# Patient Record
Sex: Male | Born: 1935 | Race: White | Hispanic: No | Marital: Married | Smoking: Former smoker
Health system: Southern US, Community
[De-identification: ages and names within clinical notes are randomized; demographics above are authoritative.]

## PROBLEM LIST (undated history)

## (undated) DIAGNOSIS — M109 Gout, unspecified: Secondary | ICD-10-CM

## (undated) DIAGNOSIS — R55 Syncope and collapse: Secondary | ICD-10-CM

## (undated) DIAGNOSIS — F0390 Unspecified dementia without behavioral disturbance: Secondary | ICD-10-CM

## (undated) DIAGNOSIS — C801 Malignant (primary) neoplasm, unspecified: Secondary | ICD-10-CM

## (undated) DIAGNOSIS — Z950 Presence of cardiac pacemaker: Secondary | ICD-10-CM

## (undated) DIAGNOSIS — E538 Deficiency of other specified B group vitamins: Secondary | ICD-10-CM

## (undated) DIAGNOSIS — D090 Carcinoma in situ of bladder: Secondary | ICD-10-CM

## (undated) DIAGNOSIS — R001 Bradycardia, unspecified: Secondary | ICD-10-CM

## (undated) DIAGNOSIS — N320 Bladder-neck obstruction: Secondary | ICD-10-CM

## (undated) DIAGNOSIS — K219 Gastro-esophageal reflux disease without esophagitis: Secondary | ICD-10-CM

## (undated) DIAGNOSIS — E785 Hyperlipidemia, unspecified: Secondary | ICD-10-CM

## (undated) DIAGNOSIS — H9190 Unspecified hearing loss, unspecified ear: Secondary | ICD-10-CM

## (undated) HISTORY — PX: HERNIA REPAIR: SHX51

## (undated) HISTORY — DX: Syncope and collapse: R55

## (undated) HISTORY — DX: Carcinoma in situ of bladder: D09.0

## (undated) HISTORY — DX: Bradycardia, unspecified: R00.1

## (undated) HISTORY — DX: Bladder-neck obstruction: N32.0

## (undated) HISTORY — DX: Hyperlipidemia, unspecified: E78.5

## (undated) HISTORY — DX: Deficiency of other specified B group vitamins: E53.8

## (undated) HISTORY — DX: Gout, unspecified: M10.9

---

## 2004-01-17 ENCOUNTER — Ambulatory Visit: Payer: Self-pay | Admitting: Internal Medicine

## 2004-02-09 ENCOUNTER — Ambulatory Visit: Payer: Self-pay | Admitting: Internal Medicine

## 2004-04-12 ENCOUNTER — Encounter: Admission: RE | Admit: 2004-04-12 | Discharge: 2004-04-12 | Payer: Self-pay | Admitting: Surgery

## 2004-04-16 ENCOUNTER — Ambulatory Visit (HOSPITAL_COMMUNITY): Admission: RE | Admit: 2004-04-16 | Discharge: 2004-04-16 | Payer: Self-pay | Admitting: Surgery

## 2004-04-16 ENCOUNTER — Ambulatory Visit (HOSPITAL_BASED_OUTPATIENT_CLINIC_OR_DEPARTMENT_OTHER): Admission: RE | Admit: 2004-04-16 | Discharge: 2004-04-16 | Payer: Self-pay | Admitting: Surgery

## 2004-04-16 ENCOUNTER — Encounter (INDEPENDENT_AMBULATORY_CARE_PROVIDER_SITE_OTHER): Payer: Self-pay | Admitting: *Deleted

## 2004-04-16 HISTORY — PX: INGUINAL HERNIA REPAIR: SHX194

## 2004-12-22 ENCOUNTER — Ambulatory Visit: Payer: Self-pay | Admitting: Internal Medicine

## 2005-07-08 ENCOUNTER — Ambulatory Visit: Payer: Self-pay | Admitting: Internal Medicine

## 2005-08-19 ENCOUNTER — Ambulatory Visit: Payer: Self-pay | Admitting: Internal Medicine

## 2005-12-12 ENCOUNTER — Ambulatory Visit: Payer: Self-pay | Admitting: Internal Medicine

## 2006-01-12 ENCOUNTER — Emergency Department (HOSPITAL_COMMUNITY): Admission: EM | Admit: 2006-01-12 | Discharge: 2006-01-12 | Payer: Self-pay | Admitting: Emergency Medicine

## 2006-05-20 ENCOUNTER — Emergency Department (HOSPITAL_COMMUNITY): Admission: EM | Admit: 2006-05-20 | Discharge: 2006-05-21 | Payer: Self-pay | Admitting: Emergency Medicine

## 2006-05-30 ENCOUNTER — Ambulatory Visit: Payer: Self-pay | Admitting: Family Medicine

## 2006-06-03 ENCOUNTER — Ambulatory Visit: Payer: Self-pay | Admitting: Internal Medicine

## 2006-06-10 ENCOUNTER — Ambulatory Visit: Payer: Self-pay | Admitting: Internal Medicine

## 2006-06-12 ENCOUNTER — Encounter: Admission: RE | Admit: 2006-06-12 | Discharge: 2006-06-12 | Payer: Self-pay | Admitting: Internal Medicine

## 2006-09-04 DIAGNOSIS — D090 Carcinoma in situ of bladder: Secondary | ICD-10-CM | POA: Insufficient documentation

## 2006-09-04 DIAGNOSIS — M109 Gout, unspecified: Secondary | ICD-10-CM | POA: Insufficient documentation

## 2006-09-08 DIAGNOSIS — E785 Hyperlipidemia, unspecified: Secondary | ICD-10-CM

## 2006-12-19 ENCOUNTER — Ambulatory Visit: Payer: Self-pay | Admitting: Internal Medicine

## 2007-05-20 ENCOUNTER — Ambulatory Visit: Payer: Self-pay | Admitting: Internal Medicine

## 2007-05-25 LAB — CONVERTED CEMR LAB
Basophils Absolute: 0.4 10*3/uL — ABNORMAL HIGH (ref 0.0–0.1)
Basophils Relative: 5.6 % — ABNORMAL HIGH (ref 0.0–1.0)
Calcium: 8.9 mg/dL (ref 8.4–10.5)
Chloride: 106 meq/L (ref 96–112)
Creatinine, Ser: 1.1 mg/dL (ref 0.4–1.5)
Eosinophils Relative: 1.8 % (ref 0.0–5.0)
GFR calc Af Amer: 83 mL/min
GFR calc non Af Amer: 69 mL/min
HCT: 42.7 % (ref 39.0–52.0)
HDL: 23.8 mg/dL — ABNORMAL LOW (ref >39.0)
Hgb A1c MFr Bld: 6 % (ref 4.6–6.0)
Lymphocytes Relative: 35.7 % (ref 12.0–46.0)
MCV: 91.7 fL (ref 78.0–100.0)
Monocytes Relative: 7.3 % (ref 3.0–12.0)
Neutro Abs: 3.8 10*3/uL (ref 1.4–7.7)
Neutrophils Relative %: 49.6 % (ref 43.0–77.0)
RBC: 4.66 M/uL (ref 4.22–5.81)
TSH: 3.02 microintl units/mL (ref 0.35–5.50)
WBC: 7.4 10*3/uL (ref 4.5–10.5)

## 2007-07-23 ENCOUNTER — Ambulatory Visit: Payer: Self-pay | Admitting: Cardiology

## 2007-07-23 ENCOUNTER — Ambulatory Visit: Payer: Self-pay | Admitting: Internal Medicine

## 2007-07-24 ENCOUNTER — Encounter: Payer: Self-pay | Admitting: Internal Medicine

## 2007-07-24 ENCOUNTER — Ambulatory Visit: Payer: Self-pay | Admitting: Vascular Surgery

## 2007-07-24 ENCOUNTER — Inpatient Hospital Stay (HOSPITAL_COMMUNITY): Admission: EM | Admit: 2007-07-24 | Discharge: 2007-07-24 | Payer: Self-pay | Admitting: Emergency Medicine

## 2007-08-10 ENCOUNTER — Ambulatory Visit: Payer: Self-pay | Admitting: Internal Medicine

## 2007-11-19 ENCOUNTER — Ambulatory Visit: Payer: Self-pay | Admitting: Internal Medicine

## 2008-01-20 ENCOUNTER — Ambulatory Visit: Payer: Self-pay | Admitting: Internal Medicine

## 2008-01-21 ENCOUNTER — Ambulatory Visit: Payer: Self-pay

## 2008-01-21 ENCOUNTER — Encounter: Payer: Self-pay | Admitting: Internal Medicine

## 2008-02-03 ENCOUNTER — Ambulatory Visit: Payer: Self-pay | Admitting: Internal Medicine

## 2008-02-03 DIAGNOSIS — N320 Bladder-neck obstruction: Secondary | ICD-10-CM | POA: Insufficient documentation

## 2008-05-19 ENCOUNTER — Ambulatory Visit: Payer: Self-pay | Admitting: Internal Medicine

## 2008-05-23 ENCOUNTER — Encounter: Payer: Self-pay | Admitting: Internal Medicine

## 2008-05-23 ENCOUNTER — Ambulatory Visit: Payer: Self-pay | Admitting: Internal Medicine

## 2008-05-26 ENCOUNTER — Ambulatory Visit: Payer: Self-pay | Admitting: Internal Medicine

## 2008-05-27 ENCOUNTER — Ambulatory Visit: Payer: Self-pay

## 2008-05-30 LAB — CONVERTED CEMR LAB
Chloride: 109 meq/L (ref 96–112)
GFR calc non Af Amer: 68.39 mL/min (ref >60)
Glucose, Bld: 113 mg/dL — ABNORMAL HIGH (ref 70–99)
Hemoglobin: 14.2 g/dL (ref 13.0–17.0)
Lymphocytes Relative: 30.5 % (ref 12.0–46.0)
Lymphs Abs: 2.6 10*3/uL (ref 0.7–4.0)
MCV: 92 fL (ref 78.0–100.0)
Monocytes Relative: 9.2 % (ref 3.0–12.0)
Neutro Abs: 4.7 10*3/uL (ref 1.4–7.7)
Neutrophils Relative %: 57.1 % (ref 43.0–77.0)
Platelets: 173 10*3/uL (ref 150.0–400.0)
Potassium: 3.9 meq/L (ref 3.5–5.1)
RBC: 4.44 M/uL (ref 4.22–5.81)
RDW: 12.5 % (ref 11.5–14.6)
WBC: 8.4 10*3/uL (ref 4.5–10.5)

## 2008-06-01 ENCOUNTER — Ambulatory Visit: Payer: Self-pay

## 2008-06-01 ENCOUNTER — Encounter: Payer: Self-pay | Admitting: Internal Medicine

## 2008-06-02 ENCOUNTER — Ambulatory Visit (HOSPITAL_COMMUNITY): Admission: RE | Admit: 2008-06-02 | Discharge: 2008-06-02 | Payer: Self-pay | Admitting: Internal Medicine

## 2008-06-02 ENCOUNTER — Ambulatory Visit: Payer: Self-pay | Admitting: Internal Medicine

## 2008-06-13 ENCOUNTER — Ambulatory Visit: Payer: Self-pay

## 2008-07-21 ENCOUNTER — Ambulatory Visit: Payer: Self-pay | Admitting: Internal Medicine

## 2008-07-21 DIAGNOSIS — R001 Bradycardia, unspecified: Secondary | ICD-10-CM

## 2008-07-21 LAB — CONVERTED CEMR LAB
BUN: 20 mg/dL (ref 6–23)
Basophils Absolute: 0 10*3/uL (ref 0.0–0.1)
Calcium: 9.2 mg/dL (ref 8.4–10.5)
Creatinine, Ser: 1.1 mg/dL (ref 0.4–1.5)
Eosinophils Relative: 0.3 % (ref 0.0–5.0)
GFR calc non Af Amer: 68.36 mL/min (ref >60)
Glucose, Bld: 109 mg/dL — ABNORMAL HIGH (ref 70–99)
HCT: 43.1 % (ref 39.0–52.0)
Hemoglobin: 14.9 g/dL (ref 13.0–17.0)
INR: 1 (ref 0.8–1.0)
Lymphocytes Relative: 20.2 % (ref 12.0–46.0)
MCHC: 34.5 g/dL (ref 30.0–36.0)
MCV: 91.9 fL (ref 78.0–100.0)
Monocytes Absolute: 0.8 10*3/uL (ref 0.1–1.0)
Sodium: 139 meq/L (ref 135–145)
WBC: 10.8 10*3/uL — ABNORMAL HIGH (ref 4.5–10.5)
aPTT: 27.4 s (ref 21.7–28.8)

## 2008-07-28 ENCOUNTER — Inpatient Hospital Stay (HOSPITAL_COMMUNITY): Admission: AD | Admit: 2008-07-28 | Discharge: 2008-07-29 | Payer: Self-pay | Admitting: Internal Medicine

## 2008-07-28 ENCOUNTER — Ambulatory Visit: Payer: Self-pay | Admitting: Cardiovascular Disease

## 2008-07-28 HISTORY — PX: PACEMAKER PLACEMENT: SHX43

## 2008-08-11 ENCOUNTER — Encounter: Payer: Self-pay | Admitting: Internal Medicine

## 2008-08-15 ENCOUNTER — Telehealth: Payer: Self-pay | Admitting: Internal Medicine

## 2008-08-15 ENCOUNTER — Ambulatory Visit: Payer: Self-pay | Admitting: Internal Medicine

## 2008-08-15 LAB — CONVERTED CEMR LAB
Basophils Relative: 0.8 % (ref 0.0–3.0)
HCT: 42.6 % (ref 39.0–52.0)
MCHC: 35 g/dL (ref 30.0–36.0)
MCV: 90.4 fL (ref 78.0–100.0)
Platelets: 191 10*3/uL (ref 150.0–400.0)
RBC: 4.71 M/uL (ref 4.22–5.81)
WBC: 19.2 10*3/uL (ref 4.5–10.5)

## 2008-08-18 ENCOUNTER — Ambulatory Visit: Payer: Self-pay | Admitting: Internal Medicine

## 2008-08-18 ENCOUNTER — Inpatient Hospital Stay (HOSPITAL_COMMUNITY): Admission: AD | Admit: 2008-08-18 | Discharge: 2008-08-23 | Payer: Self-pay | Admitting: Internal Medicine

## 2008-08-18 ENCOUNTER — Ambulatory Visit: Payer: Self-pay | Admitting: Cardiology

## 2008-08-18 HISTORY — PX: PACEMAKER REMOVAL: SHX5066

## 2008-08-20 ENCOUNTER — Encounter: Payer: Self-pay | Admitting: Internal Medicine

## 2008-08-23 ENCOUNTER — Ambulatory Visit: Payer: Self-pay | Admitting: Infectious Diseases

## 2008-08-23 ENCOUNTER — Encounter: Payer: Self-pay | Admitting: Internal Medicine

## 2008-08-25 ENCOUNTER — Ambulatory Visit: Payer: Self-pay

## 2008-09-02 ENCOUNTER — Telehealth: Payer: Self-pay | Admitting: Internal Medicine

## 2008-09-08 ENCOUNTER — Ambulatory Visit: Payer: Self-pay | Admitting: Internal Medicine

## 2008-09-18 HISTORY — PX: PACEMAKER PLACEMENT: SHX43

## 2008-09-26 ENCOUNTER — Telehealth: Payer: Self-pay | Admitting: Internal Medicine

## 2008-09-27 ENCOUNTER — Ambulatory Visit: Payer: Self-pay | Admitting: Internal Medicine

## 2008-09-27 LAB — CONVERTED CEMR LAB
Basophils Relative: 0.5 % (ref 0.0–3.0)
CO2: 30 meq/L (ref 19–32)
Chloride: 106 meq/L (ref 96–112)
Creatinine, Ser: 1.1 mg/dL (ref 0.4–1.5)
Eosinophils Absolute: 0.2 10*3/uL (ref 0.0–0.7)
GFR calc non Af Amer: 68.33 mL/min (ref >60)
Glucose, Bld: 87 mg/dL (ref 70–99)
INR: 1.1 — ABNORMAL HIGH (ref 0.8–1.0)
Lymphocytes Relative: 33.4 % (ref 12.0–46.0)
Monocytes Absolute: 0.7 10*3/uL (ref 0.1–1.0)
Neutro Abs: 4.1 10*3/uL (ref 1.4–7.7)
Potassium: 4.7 meq/L (ref 3.5–5.1)
RBC: 4.28 M/uL (ref 4.22–5.81)
Sodium: 141 meq/L (ref 135–145)
WBC: 7.5 10*3/uL (ref 4.5–10.5)
aPTT: 29 s — ABNORMAL HIGH (ref 21.7–28.8)

## 2008-09-29 ENCOUNTER — Observation Stay (HOSPITAL_COMMUNITY): Admission: AD | Admit: 2008-09-29 | Discharge: 2008-09-30 | Payer: Self-pay | Admitting: Internal Medicine

## 2008-09-29 ENCOUNTER — Ambulatory Visit: Payer: Self-pay | Admitting: Internal Medicine

## 2008-09-30 ENCOUNTER — Encounter: Payer: Self-pay | Admitting: Internal Medicine

## 2008-10-17 ENCOUNTER — Ambulatory Visit: Payer: Self-pay

## 2008-10-17 ENCOUNTER — Encounter: Payer: Self-pay | Admitting: Internal Medicine

## 2008-11-01 ENCOUNTER — Ambulatory Visit: Payer: Self-pay | Admitting: Internal Medicine

## 2008-12-08 ENCOUNTER — Ambulatory Visit: Payer: Self-pay | Admitting: Internal Medicine

## 2009-01-27 ENCOUNTER — Ambulatory Visit: Payer: Self-pay | Admitting: Internal Medicine

## 2009-06-23 ENCOUNTER — Ambulatory Visit: Payer: Self-pay | Admitting: Internal Medicine

## 2009-06-23 DIAGNOSIS — R413 Other amnesia: Secondary | ICD-10-CM | POA: Insufficient documentation

## 2009-06-27 LAB — CONVERTED CEMR LAB
ALT: 17 units/L (ref 0–53)
Basophils Absolute: 0 10*3/uL (ref 0.0–0.1)
Bilirubin, Direct: 0.1 mg/dL (ref 0.0–0.3)
Chloride: 104 meq/L (ref 96–112)
Eosinophils Absolute: 0.1 10*3/uL (ref 0.0–0.7)
GFR calc non Af Amer: 65.45 mL/min (ref >60)
Glucose, Bld: 92 mg/dL (ref 70–99)
HCT: 42.4 % (ref 39.0–52.0)
Hemoglobin: 14.8 g/dL (ref 13.0–17.0)
Lymphocytes Relative: 28 % (ref 12.0–46.0)
Lymphs Abs: 2.4 10*3/uL (ref 0.7–4.0)
Monocytes Relative: 7.8 % (ref 3.0–12.0)
Platelets: 198 10*3/uL (ref 150.0–400.0)
RBC: 4.61 M/uL (ref 4.22–5.81)
Sodium: 141 meq/L (ref 135–145)
TSH: 2.98 microintl units/mL (ref 0.35–5.50)
Total Protein: 6.6 g/dL (ref 6.0–8.3)
Vitamin B-12: 197 pg/mL — ABNORMAL LOW (ref 211–911)

## 2009-06-28 ENCOUNTER — Ambulatory Visit: Payer: Self-pay | Admitting: Internal Medicine

## 2009-06-28 DIAGNOSIS — E538 Deficiency of other specified B group vitamins: Secondary | ICD-10-CM | POA: Insufficient documentation

## 2009-07-05 ENCOUNTER — Ambulatory Visit: Payer: Self-pay | Admitting: Internal Medicine

## 2009-07-24 IMAGING — CT CT HEAD W/O CM
1 of 2 series · 13 of 30 positions shown, 17 images · non-contrast
Comparison: None.

CLINICAL DATA: Syncopal episode

CT HEAD WITHOUT CONTRAST
TECHNIQUE: Contiguous axial images were obtained from the base of
the skull through the vertex without contrast.

[Series 2: brain · axial · 0.44mm/px · z∈[-107,+32]mm · 13 of 32 slices shown, 17 images]
[im 3/32  brain]
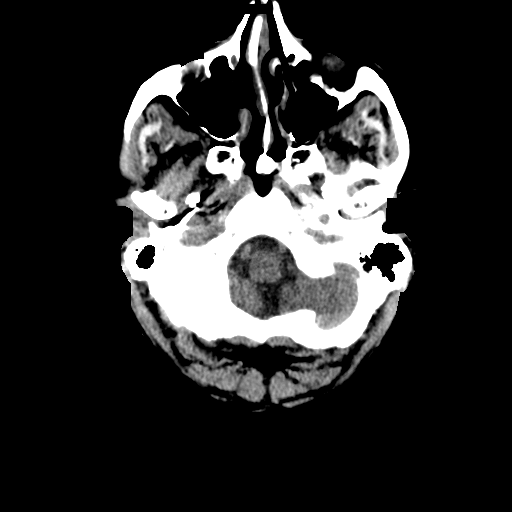
[im 3/32  bone]
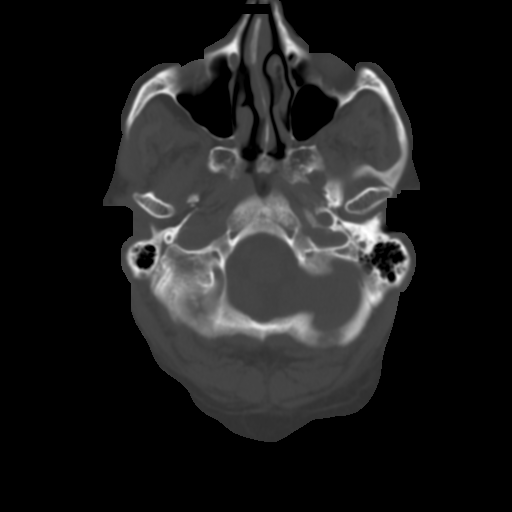
[im 5/32  brain]
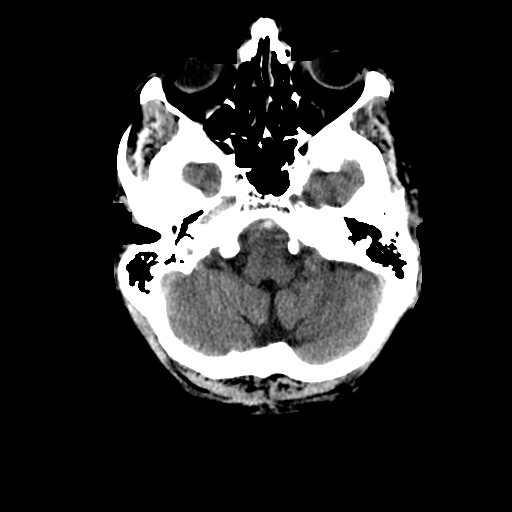
[im 7/32  brain]
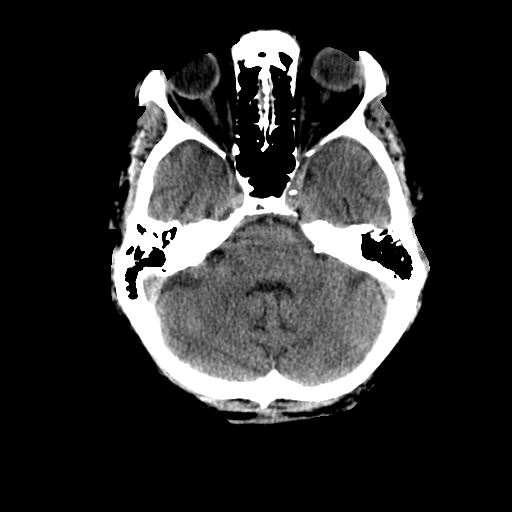
[im 9/32  brain]
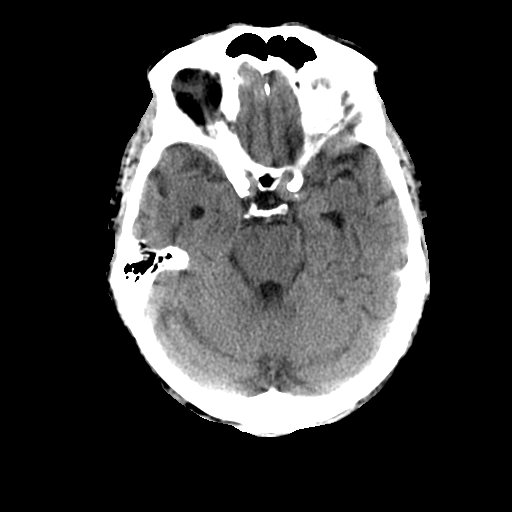
[im 12/32  brain]
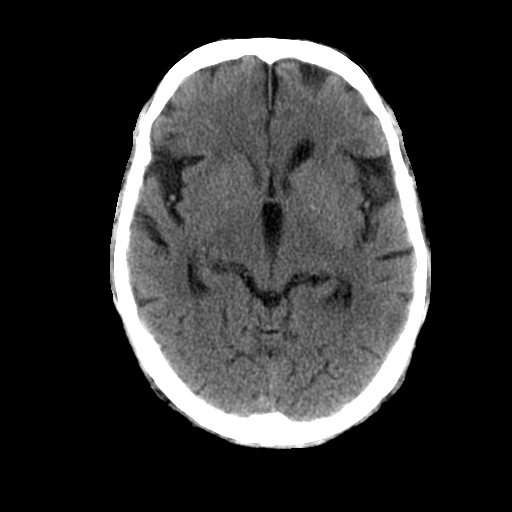
[im 12/32  bone]
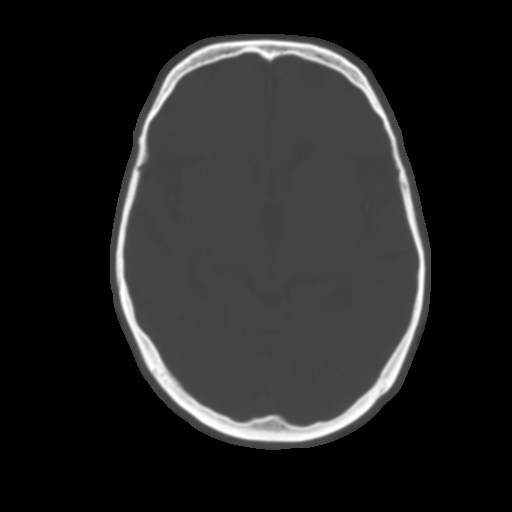
[im 14/32  brain]
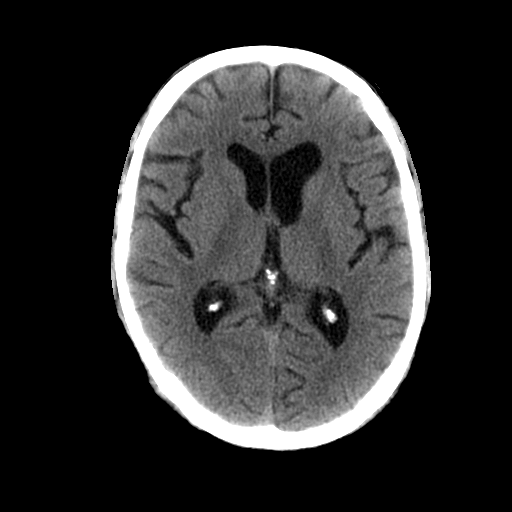
[im 16/32  brain]
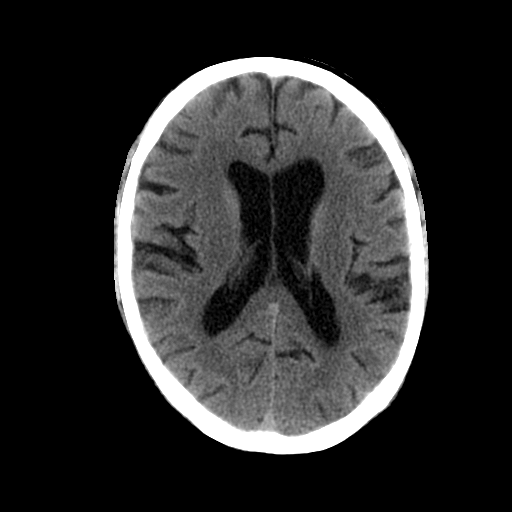
[im 18/32  brain]
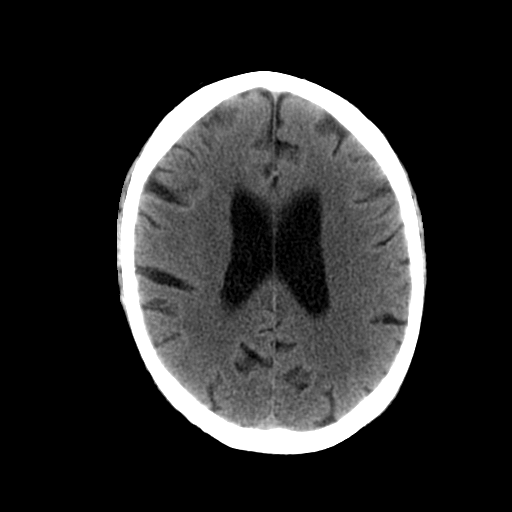
[im 20/32  brain]
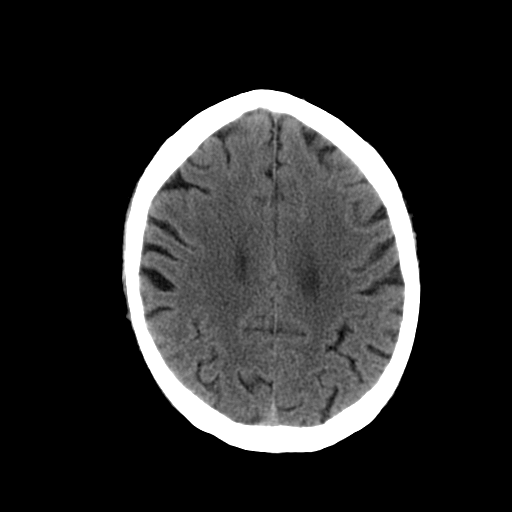
[im 20/32  bone]
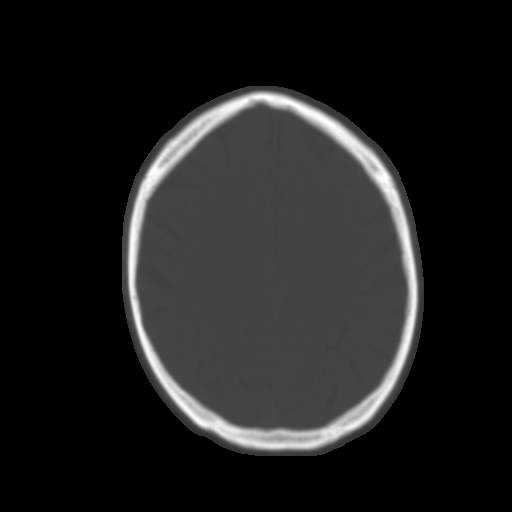
[im 23/32  brain]
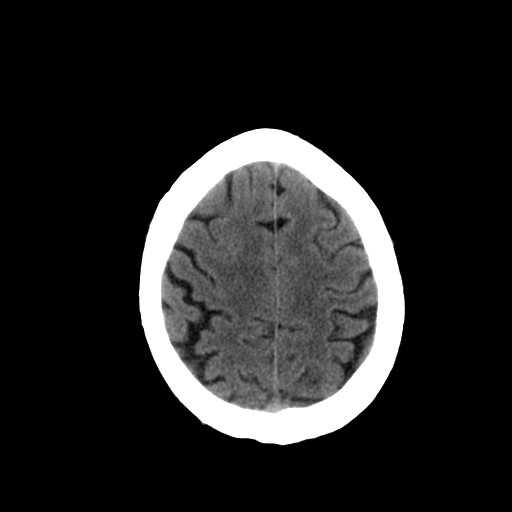
[im 25/32  brain]
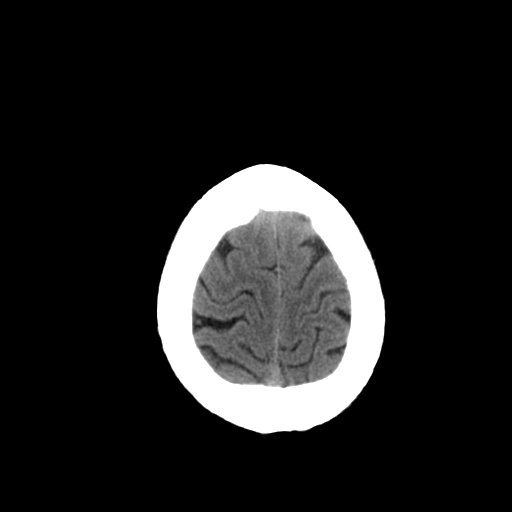
[im 27/32  brain]
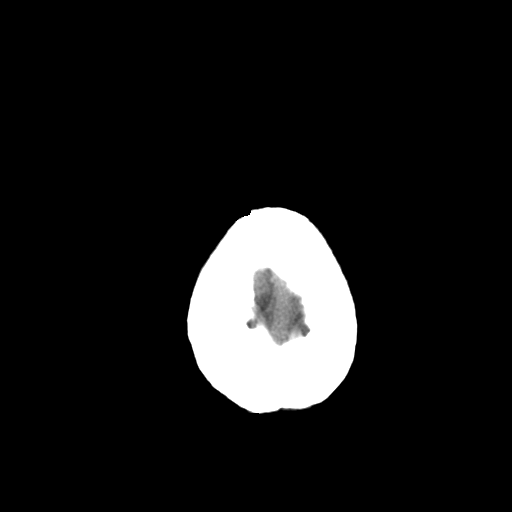
[im 29/32  brain]
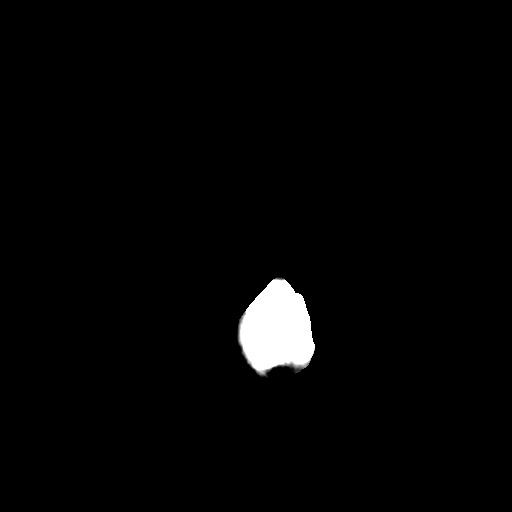
[im 29/32  bone]
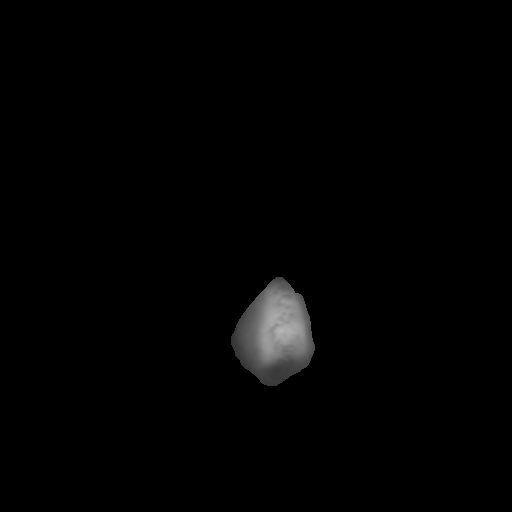

[13 of 30 positions shown; findings below may reference images not displayed]

FINDINGS: There is no evidence for acute infarction, intracranial
hemorrhage, mass lesion, hydrocephalus, or extra-axial fluid.  Mild
age appropriate atrophy is present.  Chronic microvascular ischemic
change is present in the periventricular and subcortical white
matter. Calvarium intact.  Sinuses clear, with moderately advanced
vascular calcification in the carotid siphon regions.
IMPRESSION: No acute intracranial findings.

## 2009-08-07 ENCOUNTER — Ambulatory Visit: Payer: Self-pay | Admitting: Internal Medicine

## 2009-09-04 ENCOUNTER — Ambulatory Visit: Payer: Self-pay | Admitting: Internal Medicine

## 2009-10-05 ENCOUNTER — Ambulatory Visit: Payer: Self-pay | Admitting: Internal Medicine

## 2009-10-20 ENCOUNTER — Ambulatory Visit: Payer: Self-pay | Admitting: Internal Medicine

## 2009-11-09 ENCOUNTER — Ambulatory Visit: Payer: Self-pay | Admitting: Internal Medicine

## 2009-12-07 ENCOUNTER — Ambulatory Visit: Payer: Self-pay | Admitting: Internal Medicine

## 2010-01-18 ENCOUNTER — Ambulatory Visit: Payer: Self-pay | Admitting: Internal Medicine

## 2010-02-06 ENCOUNTER — Ambulatory Visit: Payer: Self-pay | Admitting: Internal Medicine

## 2010-03-08 ENCOUNTER — Ambulatory Visit
Admission: RE | Admit: 2010-03-08 | Discharge: 2010-03-08 | Payer: Self-pay | Source: Home / Self Care | Attending: Internal Medicine | Admitting: Internal Medicine

## 2010-03-22 NOTE — Assessment & Plan Note (Signed)
Summary: b12 inj//ccm 130pm/njr  Nurse Visit   Allergies: 1)  ! Lovastatin (Lovastatin)  Appended Document: b12 inj//ccm 130pm/njr   Medication Administration  Injection # 1:    Medication: Vit B12 1000 mcg    Diagnosis: VITAMIN B12 DEFICIENCY (ICD-266.2)    Route: IM    Site: L deltoid    Exp Date: 03/22/2011    Lot #: [id number]    Mfr: American Regent    Patient tolerated injection without complications    Given by: Gladis Riffle, RN (September 04, 2009 1:45 PM)  Orders Added: 1)  Vit B12 1000 mcg [J3420] 2)  Admin of Therapeutic Inj  intramuscular or subcutaneous [[id number]]

## 2010-03-22 NOTE — Assessment & Plan Note (Signed)
Summary: PC2   Visit Type:  Follow-up Referring Provider:    Primary Provider:  Birdie Sons MD  CC:  no complaints.  History of Present Illness: The patient presents today for routine electrophysiology followup. He reports doing very well since last being seen in our clinic. The patient denies symptoms of palpitations, chest pain, shortness of breath, orthopnea, PND, lower extremity edema, dizziness, presyncope, syncope, or neurologic sequela. The patient is tolerating medications without difficulties and is otherwise without complaint today.   Current Medications (verified): 1)  Avodart 0.5 Mg  Caps (Dutasteride) .... Take 1 Capsule By Mouth Every Morning 2)  Aspirin 325 Mg Tabs (Aspirin) .... Once Daily 3)  Fish Oil   Oil (Fish Oil) .... 1400mg  Once Daily 4)  Vitamin D3 5000 Units (Cholecalciferol) .... Take One Tablet By Mouth Once Every Other Day 5)  Cyanocobalamin 1000 Mcg/ml Inj Soln (Cyanocobalamin) .... Inject 1 Cc Intramuscularly Every Week X 2 Then Once Monthly  Allergies: 1)  ! Lovastatin (Lovastatin)  Past History:  Past Medical History: Reviewed history from 06/28/2009 and no changes required. BLADDER OUTLET OBSTRUCTION (ICD-596.0) SYNCOPE (ICD-780.2) due to bradycardia/ pauses CA IN SITU, BLADDER (ICD-233.7) HYPERLIPIDEMIA (ICD-272.4) GOUT (ICD-274.9) Bradycardia s/p pacemaker extraction due to MSSA 7/10 s/p pacemaker reimplantation 8/10 B12 deficiency  Past Surgical History: Reviewed history from 01/27/2009 and no changes required.  Left inguinal herniorrhaphy with mesh  04/16/2004  Molli Hazard B. Daphine Deutscher, MD    s/p pacemaker implanted 07/28/08 (  Medtronic Adapta L)  s/p pacemaker extraction due to MSSA 7/10  s/p pacemaker reimplantation 8/10  Vital Signs:  Patient profile:   77 year old male Height:      68 inches Weight:      176 pounds Pulse rate:   65 / minute BP sitting:   135 / 81  (right arm)  Vitals Entered By: Jacquelin Hawking, CMA (October 20, 2009 9:59 AM)  Physical Exam  General:  Well developed, well nourished, in no acute distress. Head:  normocephalic and atraumatic Eyes:  PERRLA/EOM intact; conjunctiva and lids normal. Mouth:  Teeth, gums and palate normal. Oral mucosa normal. Neck:  Neck supple, no JVD. No masses, thyromegaly or abnormal cervical nodes. Chest Wall:  pacemaker site and ILR sites are well healed s/p extraction and reimplantation on R side Lungs:  Clear bilaterally to auscultation and percussion. Heart:  Non-displaced PMI, chest non-tender; regular rate and rhythm, S1, S2 without murmurs, rubs or gallops. Carotid upstroke normal, no bruit. Normal abdominal aortic size, no bruits. Femorals normal pulses, no bruits. Pedals normal pulses. No edema, no varicosities. Abdomen:  Bowel sounds positive; abdomen soft and non-tender without masses, organomegaly, or hernias noted. No hepatosplenomegaly. Msk:  Back normal, normal gait. Muscle strength and tone normal. Pulses:  pulses normal in all 4 extremities Extremities:  No clubbing or cyanosis. Neurologic:  Alert and oriented x 3. Skin:  Intact without lesions or rashes. Cervical Nodes:  no significant adenopathy Psych:  Normal affect.   PPM Specifications Following MD:  Hillis Range, MD     PPM Vendor:  Medtronic     PPM Model Number:  [id number]     PPM Serial Number:  [id number] [facility] PPM DOI:  09/29/2008     PPM Implanting MD:  Hillis Range, MD  Lead 1    Location: RA     DOI: 09/29/2008     Model #: [id number]     Serial #: [id number]     Status: active Lead 2  Location: RV     DOI: 09/29/2008     Model #: [id number]     Serial #: [id number]     Status: active  Magnet Response Rate:  BOL 85 ERI  65  Indications:  Huston Foley  Explantation Comments:  system explanted  08/19/2008  PPM Follow Up Remote Check?  No Battery Voltage:  2.8 V     Battery Est. Longevity:  13.5 years     Pacer Dependent:  No       PPM Device Measurements Atrium  Amplitude: 5.6 mV, Impedance: 386 ohms,  Threshold: 0.5 V at 0.4 msec Right Ventricle  Amplitude: 8.0 mV, Impedance: 484 ohms, Threshold: 0.875 V at 0.4 msec  Episodes MS Episodes:  0     Percent Mode Switch:  0     Coumadin:  No Ventricular High Rate:  1     Atrial Pacing:  56.8%     Ventricular Pacing:  0.4%  Parameters Mode:  MVP (R)     Lower Rate Limit:  60     Upper Rate Limit:  130 Paced AV Delay:  150     Sensed AV Delay:  120 Rate Response Parameters:  ADL response-3, Exertion Response-3, Threshold Med/Low Next Cardiology Appt Due:  04/19/2010 Tech Comments:  No parameter changes.  Device function normal.   No Carelink @ this time.   ROV 6 months clinic. Altha Harm, LPN  October 20, 2009 10:12 AM n MD Comments:  agree  ICD Specifications Following MD:  Hillis Range, MD      ILR Following MD Hillis Range, MD DOI:  06/02/2008 Vendor:  Medtronic     Model Number:  [id number]     Serial Number [id number]        Impression & Recommendations:  Problem # 1:  BRADYARRHYTHMIA (ICD-427.89) no further syncope normal pacemaker function as above  Patient Instructions: 1)  return in 6 months

## 2010-03-22 NOTE — Cardiovascular Report (Signed)
Summary: Office Visit   Office Visit   Imported By: Roderic Ovens 10/31/2009 11:58:05  _____________________________________________________________________  External Attachment:    Type:   Image     Comment:   External Document

## 2010-03-22 NOTE — Assessment & Plan Note (Signed)
Summary: b12 inj/njr  Nurse Visit   Allergies: 1)  ! Lovastatin (Lovastatin)  Medication Administration  Injection # 1:    Medication: Vit B12 1000 mcg    Diagnosis: VITAMIN B12 DEFICIENCY (ICD-266.2)    Route: IM    Site: R deltoid    Exp Date: 09/19/2010    Lot #: [id number]    Mfr: American Regent    Patient tolerated injection without complications    Given by: Romualdo Bolk, CMA (AAMA) (November 09, 2009 11:28 AM)  Orders Added: 1)  Flu Vaccine 73yrs + MEDICARE PATIENTS [Q2039] 2)  Administration Flu vaccine - MCR [G0008] 3)  Vit B12 1000 mcg [J3420] 4)  Admin of Therapeutic Inj  intramuscular or subcutaneous [96372] Flu Vaccine Consent Questions     Do you have a history of severe allergic reactions to this vaccine? no    Any prior history of allergic reactions to egg and/or gelatin? no    Do you have a sensitivity to the preservative Thimersol? no    Do you have a past history of Guillan-Barre Syndrome? no    Do you currently have an acute febrile illness? no    Have you ever had a severe reaction to latex? no    Vaccine information given and explained to patient? yes    Are you currently pregnant? no    Lot Number:[id number]   Exp Date:08/18/2010   Site Given  Left Deltoid IM

## 2010-03-22 NOTE — Assessment & Plan Note (Signed)
Summary: CONSULT RE: HEALTH CONCERNS/CJR   Vital Signs:  Patient profile:   77 year old male Weight:      176.5 pounds Temp:     98.2 degrees F oral Pulse rate:   64 / minute Pulse rhythm:   regular Resp:     12 per minute BP sitting:   154 / 70  (left arm) Cuff size:   regular  Vitals Entered By: Gladis Riffle, RN (Jun 23, 2009 1:20 PM) CC: concerned about health, memory difficulties Is Patient Diabetic? No   Primary Care Provider:  Birdie Sons MD  CC:  concerned about health and memory difficulties.  History of Present Illness:  Follow-Up Visit      This is an 77 year old man who presents for Follow-up visit.  The patient denies chest pain and palpitations.  Since the last visit the patient notes no new problems or concerns except for memeory---see below..  The patient reports taking meds as prescribed.  When questioned about possible medication side effects, the patient notes none.  Wife has concerns about "very subtle" personality changes over the past 3 years. She (and he) notes that he has some memory loss: example--puts dishes in the wrong place. He prefers not driving---difficulty remembering directions. Has difficulty remembering names from his High School days.   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 Take 1 Capsule By Mouth Every Morning 2)  Aspirin 325 Mg Tabs (Aspirin) .... Once Daily 3)  Fish Oil   Oil (Fish Oil) .... 1400mg  Once Daily 4)  Vitamin D3 5000 Units (Cholecalciferol) .... Take One Tablet By Mouth Once Every  Other Day  Allergies: 1)  ! Lovastatin (Lovastatin)  Past History:  Past Medical History: Last updated: 01/27/2009 BLADDER OUTLET OBSTRUCTION (ICD-596.0) SYNCOPE (ICD-780.2) due to bradycardia/ pauses CA IN SITU, BLADDER (ICD-233.7) HYPERLIPIDEMIA (ICD-272.4) GOUT (ICD-274.9) Bradycardia s/p pacemaker extraction due to MSSA 7/10 s/p pacemaker reimplantation 8/10  Past Surgical History: Last updated: 01/27/2009  Left inguinal herniorrhaphy with mesh  04/16/2004  Molli Hazard B. Daphine Deutscher, MD    s/p pacemaker implanted 07/28/08 (  Medtronic Adapta L)  s/p pacemaker extraction due to MSSA 7/10  s/p pacemaker reimplantation 8/10  Family History: Last updated: 09/04/2006 Fam hx MI Fam hx CHF Family History Other cancer-Colon Family History of Stroke M 1st degree relative <50  Social History: Last updated: 05/23/2008 Former Smoker (quit 1960s) Rare wine Married Regular exercise-yes Retired Pediatrician  Risk Factors: Exercise: yes (09/04/2006)  Risk Factors: Smoking Status: quit (06/23/2009)  Physical Exam  General:  alert and well-developed.   Head:  normocephalic and atraumatic.   Eyes:  pupils equal and pupils round.   Ears:  R ear normal and L ear normal.   Neck:  Neck supple, no JVD. No masses, thyromegaly or abnormal cervical nodes. Chest Wall:  pacemaker site and ILR sites are well healed s/p extraction Lungs:  normal respiratory effort and no intercostal retractions.   Heart:  normal rate and regular rhythm.   Abdomen:  soft and non-tender.   Msk:  No  deformity or scoliosis noted of thoracic or lumbar spine.   Neurologic:  cranial nerves II-XII intact and gait normal.     Mental Status Assessment:  Mental Status Exam: (value/max value)    Orientation to Time: 5/5    Orientation to Place: 5/5    Registration: 3/3    Attention/Calculation: 5/5    Recall: 3/3    Language-name 2 objects: 2/2    Language-repeat: 1/1    Language-follow 3-step command: 3/3     Language-read and follow direction: 1/1    Write a sentence: 1/1    Copy design: 1/1    MSE Total score: 30/30    Impression & Recommendations:  Problem # 1:  MEMORY LOSS (ICD-780.93) 30 minute discussion with patient and wife all counselling ro FTF I don't think he has significant memory issues other than that associated with normal aging will check labs pt and wife reassured Orders: Venipuncture (16109) TLB-BMP (Basic Metabolic Panel-BMET) (80048-METABOL) TLB-CBC Platelet - w/Differential (85025-CBCD) TLB-Hepatic/Liver Function Pnl (80076-HEPATIC) TLB-B12 + Folate Pnl (60454_09811-B14/NWG) TLB-TSH (Thyroid Stimulating Hormone) (84443-TSH)  Complete Medication List: 1)  Avodart 0.5 Mg Caps (Dutasteride) .... Take 1 capsule by mouth every morning 2)  Aspirin 325 Mg Tabs (Aspirin) .... Once daily 3)  Fish Oil Oil (Fish oil) .... 1400mg  once daily 4)  Vitamin D3 5000 Units (cholecalciferol)  .... Take one tablet by mouth once every other day

## 2010-03-22 NOTE — Assessment & Plan Note (Signed)
Summary: b12 inj//ccm  Nurse Visit   Allergies: 1)  ! Lovastatin (Lovastatin)  Medication Administration  Injection # 1:    Medication: Vit B12 1000 mcg    Diagnosis: VITAMIN B12 DEFICIENCY (ICD-266.2)    Route: SQ    Site: R deltoid    Exp Date: 09/19/2010    Lot #: [id number]    Mfr: American Regent    Patient tolerated injection without complications    Given by: Lynann Beaver CMA (December 07, 2009 11:25 AM)  Orders Added: 1)  Vit B12 1000 mcg [J3420] 2)  Admin of patients own med IM/SQ [96045W]

## 2010-03-22 NOTE — Assessment & Plan Note (Signed)
Summary: B-12INJ//ALP  Nurse Visit   Allergies: 1)  ! Lovastatin (Lovastatin)  Medication Administration  Injection # 1:    Medication: Vit B12 1000 mcg    Diagnosis: VITAMIN B12 DEFICIENCY (ICD-266.2)    Route: IM    Site: R deltoid    Exp Date: 7/13    Lot #: [id number]    Mfr: American Regent    Patient tolerated injection without complications    Given by: Alfred Levins, CMA (February 08, 2010 9:15 AM)  Orders Added: 1)  Vit B12 1000 mcg [J3420] 2)  Admin of Therapeutic Inj  intramuscular or subcutaneous [56213]

## 2010-03-22 NOTE — Assessment & Plan Note (Signed)
Summary: b12 inj/njr  Nurse Visit   Allergies: 1)  ! Lovastatin (Lovastatin)  Medication Administration  Injection # 1:    Medication: Vit B12 1000 mcg    Diagnosis: VITAMIN B12 DEFICIENCY (ICD-266.2)    Route: IM    Site: L deltoid    Exp Date: 10/20/2010    Lot #: [id number]    Mfr: American Regent    Patient tolerated injection without complications    Given by: Gladis Riffle, RN (August 07, 2009 2:04 PM)  Orders Added: 1)  Vit B12 1000 mcg [J3420] 2)  Admin of Therapeutic Inj  intramuscular or subcutaneous [96372]   Medication Administration  Injection # 1:    Medication: Vit B12 1000 mcg    Diagnosis: VITAMIN B12 DEFICIENCY (ICD-266.2)    Route: IM    Site: L deltoid    Exp Date: 10/20/2010    Lot #: [id number]    Mfr: American Regent    Patient tolerated injection without complications    Given by: Gladis Riffle, RN (August 07, 2009 2:04 PM)  Orders Added: 1)  Vit B12 1000 mcg [J3420] 2)  Admin of Therapeutic Inj  intramuscular or subcutaneous [95621]

## 2010-03-22 NOTE — Assessment & Plan Note (Signed)
Summary: b12 inj//ccm  Nurse Visit   Allergies: 1)  ! Lovastatin (Lovastatin)  Medication Administration  Injection # 1:    Medication: Vit B12 1000 mcg    Diagnosis: VITAMIN B12 DEFICIENCY (ICD-266.2)    Route: IM    Site: R deltoid    Exp Date: 09/19/2010    Lot #: [id number]    Mfr: American Regent    Patient tolerated injection without complications    Given by: Gladis Riffle, RN (October 05, 2009 11:57 AM)  Orders Added: 1)  Admin of patients own med IM/SQ [96372M]   Medication Administration  Injection # 1:    Medication: Vit B12 1000 mcg    Diagnosis: VITAMIN B12 DEFICIENCY (ICD-266.2)    Route: IM    Site: R deltoid    Exp Date: 09/19/2010    Lot #: [id number]    Mfr: American Regent    Patient tolerated injection without complications    Given by: Gladis Riffle, RN (October 05, 2009 11:57 AM)  Orders Added: 1)  Admin of patients own med IM/SQ [96372M] pt did bring own vial of Vitamin B12 that was used.

## 2010-03-22 NOTE — Assessment & Plan Note (Signed)
Summary: testosterone inj//ccm  Nurse Visit   Allergies: 1)  ! Lovastatin (Lovastatin)  Medication Administration  Injection # 1:    Medication: Vit B12 1000 mcg    Diagnosis: VITAMIN B12 DEFICIENCY (ICD-266.2)    Route: IM    Site: L deltoid    Exp Date: 8/12    Lot #: [id number]    Mfr: American Regent    Patient tolerated injection without complications    Given by: Alfred Levins, CMA (March 08, 2010 1:39 PM)  Orders Added: 1)  Admin of patients own med IM/SQ [phone]

## 2010-03-22 NOTE — Assessment & Plan Note (Signed)
Summary: B12 inj/et  Nurse Visit   Past History:  Past Medical History: BLADDER OUTLET OBSTRUCTION (ICD-596.0) SYNCOPE (ICD-780.2) due to bradycardia/ pauses CA IN SITU, BLADDER (ICD-233.7) HYPERLIPIDEMIA (ICD-272.4) GOUT (ICD-274.9) Bradycardia s/p pacemaker extraction due to MSSA 7/10 s/p pacemaker reimplantation 8/10 B12 deficiency   Allergies: 1)  ! Lovastatin (Lovastatin)  Medication Administration  Injection # 1:    Medication: Vit B12 1000 mcg    Diagnosis: VITAMIN B12 DEFICIENCY (ICD-266.2)    Route: IM    Site: L deltoid    Exp Date: 10/20/2010    Lot #: [id number]    Mfr: American Regent    Patient tolerated injection without complications    Given by: Gladis Riffle, RN (Jun 28, 2009 2:31 PM)  Orders Added: 1)  Vit B12 1000 mcg [J3420] 2)  Admin of Therapeutic Inj  intramuscular or subcutaneous [96372] Prescriptions: CYANOCOBALAMIN 1000 MCG/ML INJ SOLN (CYANOCOBALAMIN) inject 1 cc intramuscularly every week x 2 then once monthly  #10 cc0 x 0   Entered by:   Gladis Riffle, RN   Authorized by:   Birdie Sons MD   Signed by:   Gladis Riffle, RN on 06/28/2009   Method used:   Electronically to        [facility] [id number] (retail)       [address] [place], [place]  [address]       Ph: [phone]       Fax: [phone]   RxID:   [id number]    Medication Administration  Injection # 1:    Medication: Vit B12 1000 mcg    Diagnosis: VITAMIN B12 DEFICIENCY (ICD-266.2)    Route: IM    Site: L deltoid    Exp Date: 10/20/2010    Lot #: [id number]    Mfr: American Regent    Patient tolerated injection without complications    Given by: Gladis Riffle, RN (Jun 28, 2009 2:31 PM)  Orders Added: 1)  Vit B12 1000 mcg [J3420] 2)  Admin of Therapeutic Inj  intramuscular or subcutaneous [47425]

## 2010-03-22 NOTE — Assessment & Plan Note (Signed)
Summary: B12 inj/et  Nurse Visit   Allergies: 1)  ! Lovastatin (Lovastatin)  Medication Administration  Injection # 1:    Medication: Vit B12 1000 mcg    Diagnosis: VITAMIN B12 DEFICIENCY (ICD-266.2)    Route: IM    Site: R deltoid    Exp Date: 11/17/2010    Lot #: [id number]    Mfr: American Regent    Patient tolerated injection without complications    Given by: pam spell,rn  Orders Added: 1)  Admin of Therapeutic Inj  intramuscular or subcutaneous [96372] 2)  Vit B12 1000 mcg [J3420]

## 2010-03-22 NOTE — Assessment & Plan Note (Signed)
Summary: b12//ccm/RESCHED/CB/pt rescd//ccm  Nurse Visit   Allergies: 1)  ! Lovastatin (Lovastatin)  Medication Administration  Injection # 1:    Medication: Vit B12 1000 mcg    Diagnosis: VITAMIN B12 DEFICIENCY (ICD-266.2)    Route: IM    Site: L deltoid    Exp Date: 7/13    Lot #: [id number]    Mfr: American Regent    Patient tolerated injection without complications    Given by: Alfred Levins, CMA (January 18, 2010 11:56 AM)  Orders Added: 1)  Admin of patients own med IM/SQ [phone]

## 2010-04-09 ENCOUNTER — Ambulatory Visit: Payer: Medicare Other | Admitting: Internal Medicine

## 2010-04-09 DIAGNOSIS — E538 Deficiency of other specified B group vitamins: Secondary | ICD-10-CM

## 2010-04-09 MED ORDER — CYANOCOBALAMIN 1000 MCG/ML IJ SOLN
1000.0000 ug | INTRAMUSCULAR | Status: DC
  Administered 2010-04-09 – 2010-06-07 (×3): 1000 ug via INTRAMUSCULAR

## 2010-04-19 ENCOUNTER — Encounter: Payer: Self-pay | Admitting: Internal Medicine

## 2010-04-19 ENCOUNTER — Encounter (INDEPENDENT_AMBULATORY_CARE_PROVIDER_SITE_OTHER): Payer: Medicare Other

## 2010-04-19 DIAGNOSIS — I495 Sick sinus syndrome: Secondary | ICD-10-CM

## 2010-05-01 NOTE — Cardiovascular Report (Signed)
Summary: Office Visit   Office Visit   Imported By: Roderic Ovens 04/25/2010 12:46:40  _____________________________________________________________________  External Attachment:    Type:   Image     Comment:   External Document

## 2010-05-01 NOTE — Procedures (Signed)
Summary: device/saf  mca   Current Medications (verified): 1)  Avodart 0.5 Mg  Caps (Dutasteride) .... Take 1 Capsule By Mouth Every Othe Rmorning 2)  Aspirin 325 Mg Tabs (Aspirin) .... Once Daily 3)  Fish Oil   Oil (Fish Oil) .... 1400mg  Once Daily 4)  Vitamin D3 5000 Units (Cholecalciferol) .... Take One Tablet By Mouth Once Every Other Day 5)  Cyanocobalamin 1000 Mcg/ml Inj Soln (Cyanocobalamin) .... Inject 1 Cc Intramuscularly Every Week X 2 Then Once Monthly  PPM Specifications Following MD:  Hillis Range, MD     PPM Vendor:  Medtronic     PPM Model Number:  ADDRL1     PPM Serial Number:  [id number] PPM DOI:  09/29/2008     PPM Implanting MD:  Hillis Range, MD  Lead 1    Location: RA     DOI: 09/29/2008     Model #: 3875     Serial #: [id number]     Status: active Lead 2    Location: RV     DOI: 09/29/2008     Model #: 4166     Serial #: [id number]     Status: active  Magnet Response Rate:  BOL 85 ERI  65  Indications:  Brady  Explantation Comments:  system explanted  08/19/2008  PPM Follow Up Pacer Dependent:  No      Episodes Coumadin:  No  Parameters Mode:  MVP (R)     Lower Rate Limit:  60     Upper Rate Limit:  130 Paced AV Delay:  150     Sensed AV Delay:  120 Rate Response Parameters:  ADL response-3, Exertion Response-3, Threshold Med/Low Tech Comments:  see paceart report. Vella Kohler  April 19, 2010 10:22 AM  ICD Specifications Following MD:  Hillis Range, MD      ILR Following MD Hillis Range, MD DOI:  06/02/2008 Vendor:  Medtronic     Model Number:  3235     Serial Number [id number]

## 2010-05-07 ENCOUNTER — Ambulatory Visit (INDEPENDENT_AMBULATORY_CARE_PROVIDER_SITE_OTHER): Payer: Medicare Other | Admitting: Internal Medicine

## 2010-05-07 DIAGNOSIS — E538 Deficiency of other specified B group vitamins: Secondary | ICD-10-CM

## 2010-05-27 LAB — GRAM STAIN

## 2010-05-27 LAB — URIC ACID: Uric Acid, Serum: 6.6 mg/dL (ref 4.0–7.8)

## 2010-05-27 LAB — DIFFERENTIAL
Basophils Relative: 0 % (ref 0–1)
Basophils Relative: 0 % (ref 0–1)
Eosinophils Absolute: 0.1 10*3/uL (ref 0.0–0.7)
Lymphocytes Relative: 21 % (ref 12–46)
Lymphocytes Relative: 29 % (ref 12–46)
Lymphs Abs: 2.2 10*3/uL (ref 0.7–4.0)
Lymphs Abs: 2.3 10*3/uL (ref 0.7–4.0)
Monocytes Absolute: 1 10*3/uL (ref 0.1–1.0)
Monocytes Relative: 12 % (ref 3–12)
Monocytes Relative: 9 % (ref 3–12)
Neutro Abs: 4.5 10*3/uL (ref 1.7–7.7)
Neutro Abs: 7.1 10*3/uL (ref 1.7–7.7)
Neutrophils Relative %: 55 % (ref 43–77)

## 2010-05-27 LAB — BASIC METABOLIC PANEL
CO2: 27 mEq/L (ref 19–32)
CO2: 28 mEq/L (ref 19–32)
Calcium: 8 mg/dL — ABNORMAL LOW (ref 8.4–10.5)
Creatinine, Ser: 1.08 mg/dL (ref 0.4–1.5)
GFR calc Af Amer: >60 mL/min (ref >60)
GFR calc non Af Amer: >60 mL/min (ref >60)
GFR calc non Af Amer: >60 mL/min (ref >60)
Sodium: 138 mEq/L (ref 135–145)
Sodium: 140 mEq/L (ref 135–145)

## 2010-05-27 LAB — ANAEROBIC CULTURE

## 2010-05-27 LAB — TYPE AND SCREEN: ABO/RH(D): B POS

## 2010-05-27 LAB — CBC
Hemoglobin: 12.1 g/dL — ABNORMAL LOW (ref 13.0–17.0)
Hemoglobin: 13.3 g/dL (ref 13.0–17.0)
MCHC: 34.3 g/dL (ref 30.0–36.0)
MCHC: 34.8 g/dL (ref 30.0–36.0)
MCV: 90.8 fL (ref 78.0–100.0)
Platelets: 202 10*3/uL (ref 150–400)
RBC: 3.77 MIL/uL — ABNORMAL LOW (ref 4.22–5.81)
RDW: 12.6 % (ref 11.5–15.5)
RDW: 13.5 % (ref 11.5–15.5)
WBC: 8.2 10*3/uL (ref 4.0–10.5)

## 2010-05-27 LAB — ABO/RH: ABO/RH(D): B POS

## 2010-05-27 LAB — CULTURE, BLOOD (ROUTINE X 2)

## 2010-05-27 LAB — WOUND CULTURE

## 2010-06-07 ENCOUNTER — Ambulatory Visit (INDEPENDENT_AMBULATORY_CARE_PROVIDER_SITE_OTHER): Payer: Medicare Other | Admitting: Internal Medicine

## 2010-06-07 DIAGNOSIS — E538 Deficiency of other specified B group vitamins: Secondary | ICD-10-CM

## 2010-07-03 NOTE — Discharge Summary (Signed)
[name], [name]                ACCOUNT NO.:  [id number]   MEDICAL RECORD NO.:  [id number]          PATIENT TYPE:  INP   LOCATION:  2503                         FACILITY:  [facility]   PHYSICIAN:  Hillis Range, MD       DATE OF BIRTH:  [birth year]   DATE OF ADMISSION:  09/29/2008  DATE OF DISCHARGE:  09/30/2008                               DISCHARGE SUMMARY   This patient has allergy to PRAVASTATIN and FLOMAX.   DISCHARGE TIME:  Greater than 45 minutes.   FINAL DIAGNOSES:  Discharging day 1 status post implant of a Medtronic  ADAPTA L dual-chamber pacemaker.  The patient says he is feeling fine.  He is A pacing overnight when he has rather marked bradycardia.   SECONDARY DIAGNOSES:  1. History of pauses picked up on loop recorder with concurrent      symptoms of presyncope.  2. Implanted dual-chamber pacemaker July 28, 2008.      a.     Methicillin-susceptible Staphylococcus aureus infection of       pacer pocket.      b.     Explant of the pacemaker August 19, 2008.  3. Gout.  4. History of bladder outlet obstruction.  5. History of bladder cancer.  6. Dyslipidemia.  7. Left inguinal herniorrhaphy with mesh.  8. Stress test with EF normal.   PROCEDURE:  On September 29, 2008 implant of the Medtronic dual-chamber  pacemaker by Dr. Hillis Range.  Chest x-ray shows no pneumothorax.  Although the right atrial lead is not optimally placed, it does function  well according to an interrogation.  The patient says he is feeling very  much better than he usually does upon awakening today.  The patient is  controlling his pain with oral analgesia.  He will go home with Keflex  250 mg four times daily for 7 days.   BRIEF HISTORY:  [name] is an 77 year old male.  He has a history of  presyncope and syncope, at first it was of unclear origin.  He had a  loop recorder implanted, the device showed that the patient had pauses  which were concurrent with presyncopal dizziness.  The patient then  underwent pacemaker implantation in June of this year.  He had a pocket  infection, the device was explanted in early July.  The patient now  returns for contralateral implant of a new pacemaker.   HOSPITAL COURSE:  The patient presents electively on August 12.  He  underwent implantation of the Medtronic dual-chamber ADAPTA device the  same day by Dr. Johney Frame.  The patient was doing well.  Mobility of the  right arm has been explained to the patient.  X-ray has been examined.  The device has been interrogated, the interrogation is fine.  The  incision has no hematoma.  He was asked to remove the bandage on the  morning of Saturday, August 14, and to leave the incision open to the  air.  He is to keep the incision dry until Thursday, August 19 and to  sponge bathe until then.  He goes  home with a new medication, Keflex 250  mg 1 tablet 1/2 hour before breakfast, lunch, dinner and bedtime for the  next 7 days.  He is to resume the following medication:  1. Avodart 0.5 mg daily.  2. Enteric coated aspirin 325 mg daily.  3. Fish oil caps 300 mg daily.  4. Vitamin D 2000 international units daily.  5. The patient also has colchicine 0.6 mg to use as needed for acute      flares of gout.   He follows up at [facility] at [address]  1. Pacer Clinic, Monday, August 30 at 9:20.  He has an appointment      with Dr. Johney Frame in September, this has been cancelled.  2. He sees Dr. Johney Frame, Friday, December 10 at 9:10.   LABORATORY STUDIES:  Pertinent to this admission were drawn on August  10.  White cells 7.5, hemoglobin 13.1, hematocrit 39.3 and platelets are  193.  Protime 11.1.  INR is 1.1.  Sodium 141, potassium 4.7, chloride  106, carbonate 30, glucose is 87, BUN is 14, creatinine 1.1.      Maple Mirza, [place]      Hillis Range, MD  Electronically Signed    GM/MEDQ  D:  09/30/2008  T:  09/30/2008  Job:  [id number]   cc:   Valetta Mole. Swords, MD

## 2010-07-03 NOTE — Discharge Summary (Signed)
[name], [name]                ACCOUNT NO.:  [id number]   MEDICAL RECORD NO.:  [id number]          PATIENT TYPE:  OIB   LOCATION:  2899                         FACILITY:  [facility]   PHYSICIAN:  Hillis Range, MD       DATE OF BIRTH:  1925-08-07   DATE OF ADMISSION:  06/02/2008  DATE OF DISCHARGE:  06/02/2008                               DISCHARGE SUMMARY   PRIMARY CARE PHYSICIAN:  Valetta Mole. Swords, MD   ALLERGIES:  This patient has intolerance of LOVASTATIN which causes  urinary retention and myalgias.   TIME FOR THIS DICTATION:  Greater than 30 minutes.   FINAL DIAGNOSES:  1. Syncope of unclear etiology.  2. Implant loop recorder by Dr. Hillis Range, Medtronic device on      June 02, 2008.   SECONDARY DIAGNOSES:  1. Benign prostatic hypertrophy.  2. History of bladder cancer.  3. Dyslipidemia.  4. Gout.  5. Status post left inguinal herniorrhaphy.  6. Carpal tunnel repair.   PROCEDURES:  The patient also had a stress echocardiogram on June 01, 2008.  The study showed no chronotropic incompetence.  The left  ventricular ejection fraction normal, no left ventricular wall motion  abnormalities.  He also had an echocardiogram for an episode of previous  syncope in June 2009.  Echocardiogram at that time showed ejection  fraction of 60-65%, no left ventricular wall motion abnormalities, mild  diastolic dysfunction, trivial mitral regurgitation, and trivial  tricuspid regurgitation.   DISCHARGE INSTRUCTIONS:  The patient is asked to remove the bulky  bandage on Friday, June 03, 2008 and leave the incision to be open to  the air.  He is to keep his incision dry for the next 5 days.  He is to  sponge bathe until Tuesday, June 07, 2008.   FOLLOWUP:  He follows up with [facility], [place], [address], Monday, June 13, 2008 at 9:40 for loop incision inspection.   DISCHARGE MEDICATIONS:  He will be discharged on his medications which  are:  1. Enteric-coated  aspirin 325 mg daily.  2. Avodart 0.5 mg daily.  3. Fish oil 300 mg twice daily.  4. Vitamin D3 2000 units daily.      Maple Mirza, [place]      Hillis Range, MD  Electronically Signed    GM/MEDQ  D:  06/02/2008  T:  06/03/2008  Job:  [id number]   cc:   Valetta Mole. Cato Mulligan, MD  Madolyn Frieze. Jens Som, MD, [facility]

## 2010-07-03 NOTE — Discharge Summary (Signed)
[name], [name]                ACCOUNT NO.:  [id number]   MEDICAL RECORD NO.:  [id number]          PATIENT TYPE:  INP   LOCATION:  3709                         FACILITY:  [facility]   PHYSICIAN:  Hillis Range, MD       DATE OF BIRTH:  [birth year]   DATE OF ADMISSION:  07/28/2008  DATE OF DISCHARGE:  07/29/2008                               DISCHARGE SUMMARY   This patient has allergies to LOVASTATIN and FLOMAX caused orthostasis.   TIME FOR THIS DICTATION EXAMINATION:  Greater than 35 minutes.   FINAL DIAGNOSES:  1. Discharging day 1 status post implantation of the Medtronic ADAPTA      L ADDRL 1, this is a dual-chamber pacemaker.  2. History of syncope, unknown etiology with loop recorder implanted      on June 02, 2008.  3. Symptomatic bradycardia with extended pauses noted on loop recorder      which coincided with presyncopal events.   SECONDARY DIAGNOSES:  1. Benign prostatic hypertrophy.  2. Dyslipidemia.  3. Gout.  4. History of syncope as described in history of present illness.  5. History of bladder cancer.  6. Status post left inguinal herniorrhaphy with mesh implant.   PROCEDURES:  July 28, 2008, implant of the Medtronic ADAPTA dual-chamber  pacemaker, Dr. Hillis Range for symptomatic bradycardia with pauses and  history of syncope/presyncope.   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  He had a loop recorder implanted on June 02, 2008.  The study showed that he has sinus node dysfunction with pauses  greater than 4 seconds which coincide with presyncopal symptoms.  The  therapeutic options were discussed with the patient.  He has no  reversible causes for bradycardia.  A pacemaker is recommended.  The  risks, benefits, and alternatives have been described to the patient, he  wishes to proceed.   HOSPITAL COURSE:  The patient presents electively on July 28, 2008, when  implantation of the dual-chamber  pacemaker with removal of the  previously implanted loop recorder by Dr. Hillis Range.  The patient has  tolerated the procedure well.  He has postprocedure interrogation of his  device which shows all values within normal limits.  The chest x-ray  shows that the leads are in appropriate position.  Movement of the left  arm has been discussed with the patient as well as incision care.  The  patient discharging with the admonition to keep his incision dry for the  next 7 days and to sponge bathe until Thursday, August 04, 2008.   Continue to use his regular medications which are as follows:  1. Enteric-coated aspirin 325 mg daily.  2. Avodart 0.5 mg daily.  3. Fish oil as before this admission.  4. Vitamins C and D as before this admission.   He follows up with [facility], [address],  1. Pacer Clinic, Wednesday, August 17, 2008, at 10 o'clock.  2. To see Dr. Johney Frame, Friday, November 04, 2008, at 9:10 in the  morning.   Of note, the patient had echocardiogram.  This was done on June 01, 2008.  Study shows that the left ventricular size normal, that the left  ventricular systolic function is normal, ejection fraction normal at  rest and with stress.  This was a stress echocardiogram.  Lab studies on  this admission were drawn on July 21, 2008.  Sodium 139, potassium 4.6,  chloride 103, carbonate 31, glucose 109, BUN is 20, creatinine 1.1.  White cells 10.8, hemoglobin 14.9, hematocrit 43.1, platelets of 182,  protime 11.2, INR is 1.      Maple Mirza, [place]      Hillis Range, MD  Electronically Signed    GM/MEDQ  D:  07/29/2008  T:  07/29/2008  Job:  [id number]   cc:   Valetta Mole. Swords, MD

## 2010-07-03 NOTE — H&P (Signed)
[name], [name]                ACCOUNT NO.:  [id number]   MEDICAL RECORD NO.:  [id number]          PATIENT TYPE:  OBV   LOCATION:  1830                         FACILITY:  [facility]   PHYSICIAN:  Valerie A. Felicity Coyer, MDDATE OF BIRTH:  11/16/1925   DATE OF ADMISSION:  07/23/2007  DATE OF DISCHARGE:                              HISTORY & PHYSICAL   PRIMARY CARE PHYSICIAN:  Valetta Mole. Swords, MD   CHIEF COMPLAINT:  Syncopal episode this morning, progressive fatigue for  several months.   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  He reports he was sitting in his chair doing a  crossword puzzle when he started to experience nausea.  He then became  dizzy.  He then says he must have spaced out as he next remembers his  wife shaking him as if to wake him up, but he denies any loss of  consciousness, no fall.  No nausea, vomiting, diaphoresis, or headache.  Wife reports that the patient's episode of unresponsiveness lasted  approximately 2-3 minutes.  There was no bowel or bladder incontinence  with this episode.  No history of seizures.  No associated chest pain.  The patient had been seen in the office approximately 2 months ago by  his primary MD due to increasing fatigue, and he states he is more tired  than he used to be with a simple exertion.  He __________ then quits. He  is nearly 82 and should expect some degree of slow down.  No previous  episodes of similar event.   PAST MEDICAL HISTORY:  1. Dyslipidemia.  2. Gout.  3. Bladder cancer.  4. Left inguinal hernia repair.  5. Carpal tunnel repair.  6. BPH.   CURRENT MEDICATIONS:  1. Flomax 0.4 mg daily.  2. Avodart 0.5 mg daily.  3. Aspirin 81 mg daily.  4. Vitamin D 400 units daily.  5. Fish oil 300 mg daily.  6. Red yeast rice 300 mg  daily.   ALLERGIES:  Include LOVASTATIN which causes urinary retention and  myalgias.   FAMILY HISTORY:  Mother died at age 18 due to heart failure and coronary  artery disease.  Father died of stroke at an unclear age.   SOCIAL HISTORY:  He quit smoking in 1963.  He has occasional alcoholic  use.  He is married and a retired Optometrist.   REVIEW OF SYSTEMS:  Negative for headache.  No chest pain, no shortness  of breath, and no unilateral weakness.  No abdominal pain.  No nausea,  vomiting or diarrhea.  No dysuria or change in bladder habits.  No  change in bowel habits.  No lower extremity swelling.  No fevers.  No  travel.  No change in appetite or weight.  Currently, notes feeling  swimmy headed.  Other systems are reviewed and negative.   PHYSICAL EXAM:  VITAL SIGNS:  Temperature 97.0, blood pressure 136/73,  heart rate  of 64, respirations 20, and sating 96% on room air.  GENERAL:  He is awake, alert, a pleasant elderly man appearing  appropriately stated age, appropriately nourished, and in no acute  distress.  HEENT:  Eyes:  PERRL.  No scleral icterus or injection.  ENT:  Shows  mucous membranes moist.  Oropharynx without erythema or exudate.  RESPIRATORY:  Shows no increased work of breathing or distress.  LUNGS:  His lung sounds clear to auscultation bilaterally without  wheeze, crackle or rhonchi.  CARDIOVASCULAR:  Regular rate and rhythm with a normal S1-S2.  EXTREMITIES:  No lower extremity edema or swelling.  No bilateral  carotid bruits appreciated.  No JVD.  NECK:  Supple.  No masses.  No thyromegaly appreciated.  No thyroid  nodules.  ABDOMEN:  Soft and nontender with good bowel sounds.  There is no  appreciable hepatosplenomegaly.  LYMPHATIC:  Show no cervical lymphadenopathy.  No axillary  lymphadenopathy.  NEUROLOGICALLY:  There are no motor or sensory deficits grossly  appreciated.  Deep tender reflex are 2+ bilaterally.  PSYCH:  He is awake, alert,  oriented x3 and very pleasant.  SKIN:  No rashes or ulcers to his extremities, neck, and chest.  No  nodules to palpations on his extremities.   LABORATORY DATA:  Current laboratory data shows a normal basic  metabolic.  Negative point-of-care enzymes cardiac x1.  EKG is sinus  brady at 52 beats per minute.  No acute findings.  Reviewed personally  by me.  Chest x-ray also personally reviewed and shows left lung base  atelectasis, but no active disease.   ASSESSMENT/PLAN:  1. Syncopal episode.  Unclear etiology as this occurred at rest      without previous exertion.  Differential include cardiac versus      neurologic versus metabolic, very low suspicion for a seizure and      given HPI and previous history and exam.  We will admit, place on      telemetry, check 2-D echo, carotid Dopplers as well as head CT.      Consider outpatient Holter monitor, if evaluation work appears      unremarkable.  TSH checked in April 2009 was normal.  Plan to check      UA and D-dimer given the history of a long road trip greater than 9      hours last weekend to rule out PE; and if abnormal D-dimer, plan to      pursue CT angio, cycle cardiac enzymes and check CBC to rule out      anemia or leukocytosis.  The patient hopes for discharge in the      next 24 hours given a social band engagement tomorrow evening, and      his discharge will be depend on symptoms as well as workup of the      above.  2. Fatigue.  Nonspecific symptoms, ongoing for months.  Question if      related to problem number 1, workup as mentioned above and      outpatient followup for continued ongoing evaluation should no      abnormality be identified here.  3. Dyslipidemia.  Not currently on statins due to intolerance.  We      will continue his home medications as listed at discharge.  4. History of benign prostatic hypertrophy.  No current complaints.      Continue current medications.  Condition      stable.  5. History of  bladder cancer.  6. Check UA.      Valerie A. Felicity Coyer, MD  Electronically Signed     VAL/MEDQ  D:  07/23/2007  T:  07/24/2007  Job:  [id number]

## 2010-07-03 NOTE — Op Note (Signed)
NAMETEONDRE, JAROSZ                ACCOUNT NO.:  [id number]   MEDICAL RECORD NO.:  [id number]          PATIENT TYPE:  INP   LOCATION:  3709                         FACILITY:  [facility]   PHYSICIAN:  Hillis Range, MD       DATE OF BIRTH:  13-Dec-1925   DATE OF PROCEDURE:  07/28/2008  DATE OF DISCHARGE:                               OPERATIVE REPORT   SURGEON:  Hillis Range, MD   PREPROCEDURE DIAGNOSES:  1. Syncope.  2. Sinus node dysfunction with symptomatic bradycardia.   POSTPROCEDURE DIAGNOSES:  1. Syncope.  2. Sinus node dysfunction with symptomatic bradycardia.   PROCEDURES:  1. Dual-chamber pacemaker implantation.  2. Implantable loop recorder removal.   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  He was  originally evaluated by me for recurrent frank episodes of syncope and  subsequently underwent implantable loop recorder implantation.  He  followed up in my office on July 21, 2008, at which time an interrogation  of his loop recorder demonstrated pauses up to 10 seconds in duration, 4-  second, 6-second, and 10-second pauses were in fact observed.  The  patient reports associated dizziness during these episodes with  presyncope.  He therefore presents today for dual-chamber pacemaker  implantation with implantable loop recorder removal.   DESCRIPTION OF PROCEDURE:  Informed written consent was obtained, and  the patient was brought to the electrophysiology lab in the fasting  state.  He was adequately sedated with intravenous Versed and fentanyl  as outlined in the nursing report.  The patient's left chest as well as  the region overlying his implantable loop recorder were prepped and  draped in the usual sterile fashion by the EP lab staff.  A 5-cm  incision was made over the left deltopectoral region.  A left  subcutaneous pacemaker pocket was fashioned using a combination of  sharp  and blunt dissection.  Electrocautery was used to assure hemostasis.  Dissection down to the left cephalic vein was performed, however, this  cephalic vein was too small to accommodate transvenous leads.  The lead  was therefore tied off with 2 separate #1 silk sutures.  Hemostasis was  assured.  Using a modified percutaneous Seldinger technique with  fluoroscopic visualization, the left axillary vein was cannulated  without requiring contrast.  Through the left axillary vein, a Medtronic  model 5076 - 52 (serial number [id number]) right atrial lead and a  Medtronic model 5076 - 58 (serial number [id number]) right ventricular  lead were advanced with fluoroscopic visualization into the right atrial  appendage and mid right ventricular septal positions respectively.  Initial lead measurements revealed an atrial lead P wave of 5.5 mV with  an impedance of 675 ohms and a threshold of 1 V at 0.5 msec.  The  ventricular lead R wave measured 10.5 mV with impedance of 918 ohms and  a threshold of 0.8 V at 0.5 msec.  The leads were then secured to the  pectoralis fascia using #2 silk suture over the suture sleeves.  The  leads were then connected to a Medtronic Adapta L, model ADDDR L1  (serial number [id number]) dual-chamber pacemaker.  The previously  fashioned pacemaker pocket was irrigated with copious gentamicin  solution.  The pacemaker was then placed into the pocket.  The pocket  was then closed in 2 layers with 2.0 Vicryl suture for the subcutaneous  and subcuticular layers.  Steri-Strips and a sterile dressing were then  applied.  Attention was then turned to removal of the implantable loop  recorder.  A 1.5-cm incision was made over the loop recorder, and the  loop recorder was removed in its entirety with gentle manual traction.  The pocket was then irrigated with copious gentamicin solution, and  electrocautery was used to assure hemostasis.  The pocket was then  closed in 2  layers with 2-0 Vicryl suture for the subcutaneous and  subcuticular layers.  Steri-Strips and sterile dressing were then  applied.  There were no early apparent complications.   CONCLUSIONS:  1. Sinus node dysfunction with recurrent syncope and symptomatic      bradycardia.  2. Successful dual-chamber pacemaker implantation as described above.  3. Successful implantable loop recorder removal.  4. No early apparent complications.      Hillis Range, MD  Electronically Signed     JA/MEDQ  D:  07/28/2008  T:  07/28/2008  Job:  [id number]   cc:   Gordy Savers, MD  Valetta Mole. Swords, MD

## 2010-07-03 NOTE — Discharge Summary (Signed)
[name], [name]                ACCOUNT NO.:  [id number]   MEDICAL RECORD NO.:  [id number]          PATIENT TYPE:  INP   LOCATION:  2023                         FACILITY:  [facility]   PHYSICIAN:  Hillis Range, MD       DATE OF BIRTH:  [birth year]   DATE OF ADMISSION:  08/18/2008  DATE OF DISCHARGE:  08/23/2008                               DISCHARGE SUMMARY   PRIMARY CARE PHYSICIAN:  Valetta Mole. Swords, MD   DISCHARGE DIAGNOSES:  1. Pacemaker pocket infection status post extraction of dual chamber      pacemaker system with revision of the pocket by Dr. Hillis Range on      August 19, 2008.  2. Sinus node dysfunction with previous syncopal episode.  The patient      is to refrain from driving, to reevaluated by Dr. Johney Frame.  3. Gout exacerbation, requiring colchicine and oxycodone therapy this      admission.  4. Infectious Disease consult with Dr. Lina Sayre this admission for      antibiotic therapy.  The patient with wound culture positive cocci      in pairs, moderate Staphylococcus aureus.  The patient was      initially treated with vancomycin and being discharged home on      amoxicillin p.o.  5. Bradycardia with nonsustained ventricular tachycardia noted this      admission.  The patient is asymptomatic.   PAST MEDICAL HISTORY:  1. Bladder outlet obstruction.  2. Syncope due to bradycardia/pauses.  3. Bladder cancer in situ.  4. Hyperlipidemia.  5. Gout.  6. Left inguinal herniorrhaphy with mesh, February 2006.  7. Previous pacemaker implantation, June 2010.   HOSPITAL COURSE:  [name] is an 77 year old Caucasian gentleman seen in  the office on August 18, 2008 by Dr. Johney Frame for followup.  The patient with  fever and chills noted drainage from his recently removed Reveal pocket.  The patient had been started on Keflex but subsequently developed  further pain and redness over his pacemaker site.  We decided to admit  the patient for blood cultures and IV antibiotics and  removal of his  pacemaker.  Blood cultures were obtained on August 18, 2008, no growth to  date.  Wound culture results as stated above.  Antibiotic therapy  initiated.  The patient with extraction of pacemaker on August 19, 2008,  tolerated the procedure without complications, and continued on IV  antibiotics.  Dr. Maurice March asked to evaluate the patient for further  recommendations at time of discharge.  Recommendations for amoxicillin  for 2 more weeks.  Therefore, the patient is being discharged home in  stable condition.  He is afebrile on day of discharge with a T-max of 99  degrees over the last 24 hours and blood pressure 115/66.  Dr. Johney Frame  has had a long discussion with the patient regarding risk for syncope,  symptomatic bradycardia, and even asystole without permanent pacemaker.  The patient is clear that he will not remain in the hospital until the  new device is implanted.  He understands that syncope  or fall could  result in serious injury or death, but he is willing to accept this risk  with discharge.  He is not clear to drive until permanent pacemaker  implanted.  The patient verbalizes understanding.  He needs to return to  the hospital if syncope occurs.   DISCHARGE MEDICATIONS:  1. Amoxicillin 500 mg p.o. t.i.d.  2. Colchicine 0.6 mg b.i.d. for 4 more days.  3. Oxycodone 5 mg every 6 hours p.r.n.  4. Aspirin 325 daily.  5. Avodart 0.5 mg daily.   The patient is instructed to stop the Keflex he was previously on.   ACTIVITY:  No driving.   INCISIONAL CARE:  He is to keep his incision dry.  I have scheduled him  for wound check in the [facility] and suture removal on Thursday, August 25, 2008 at 12 noon.   DURATION OF DISCHARGE ENCOUNTER:  Over 30 minutes.      Dorian Pod, ACNP      Hillis Range, MD  Electronically Signed    MB/MEDQ  D:  08/23/2008  T:  08/24/2008  Job:  [id number]   cc:   Fransisco Hertz, M.D.

## 2010-07-03 NOTE — Op Note (Signed)
NAMEMYRAN, ARCIA                ACCOUNT NO.:  [id number]   MEDICAL RECORD NO.:  [id number]          PATIENT TYPE:  OIB   LOCATION:  2899                         FACILITY:  [facility]   PHYSICIAN:  Hillis Range, MD       DATE OF BIRTH:  1926-01-10   DATE OF PROCEDURE:  06/02/2008  DATE OF DISCHARGE:  06/02/2008                               OPERATIVE REPORT   SURGEON:  Hillis Range, MD   PREPROCEDURE DIAGNOSIS:  Unexplained recurrent syncope.   POSTPROCEDURE DIAGNOSES:  Unexplained recurrent syncope.   PROCEDURE:  Implantable loop recorder implantation.   INTRODUCTION:  Mr. Roback is a pleasant 77 year old gentleman with  recurrent unexplained syncope who presents today for implantation of an  implantable loop recorder.  He has a preserved ejection fraction.  He  has had multiple episodes of syncope over the past year.  These episodes  typically occur every 4-5 months.  He therefore presents for implantable  loop recorder implantation.   DESCRIPTION OF PROCEDURE:  Informed written consent was obtained, and  the patient was brought to the electrophysiology lab in the fasting  state.  He did not receive intravenous narcotics.  The patient's left  chest was prepped and draped in the usual fashion by the EP lab staff.  The skin overlying the left chest was infiltrated with lidocaine for  local analgesia.  A 1.5-cm incision was made over the fourth intercostal  space along the left sternal border.  A subcutaneous loop recorder  pocket was fashioned using a combination of sharp and blunt dissection.  Electrocautery was used to assure hemostasis.  The pocket was then  irrigated with copious gentamicin solution.  A Medtronic Reveal XT model  [id number] (serial number [id number]) implantable loop recorder was placed  into the previously fashioned pocket.  The device was not secured to the  pectoralis fascia as it was noted to have a very stable position.  The  pocket was then closed in 2 layers  with 2.0 Vicryl suture for the  subcutaneous and subcuticular layers.  Steri-Strips and a sterile  bandage were then applied.  There were no early apparent complications.   CONCLUSIONS:  1. Unexplained recurrent syncope.  2. Successful implantation of a Reveal implantable loop recorder.  3. No early apparent complications.      Hillis Range, MD  Electronically Signed    JA/MEDQ  D:  06/02/2008  T:  06/03/2008  Job:  [id number]   cc:   Gordy Savers, MD  Valetta Mole. Swords, MD

## 2010-07-03 NOTE — Op Note (Signed)
NAMECAYLE, [name]                ACCOUNT NO.:  [id number]   MEDICAL RECORD NO.:  [id number]          PATIENT TYPE:  INP   LOCATION:  2807                         FACILITY:  [facility]   PHYSICIAN:  Hillis Range, MD       DATE OF BIRTH:  11-06-1925   DATE OF PROCEDURE:  DATE OF DISCHARGE:                               OPERATIVE REPORT   SURGEON:  Hillis Range, MD   PREPROCEDURE DIAGNOSES:  1. Sinus node dysfunction.  2. Syncope.   POSTPROCEDURE DIAGNOSES:  1. Sinus node dysfunction.  2. Syncope.   PROCEDURE:  Dual-chamber pacemaker implantation.   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  He has had recurrent  episodes of syncope in the past for which he had an implantable loop  recorder placed, which documented pauses in excess of 6 seconds.  He  initially underwent dual-chamber pacemaker implantation by me on July 28, 2008.  Unfortunately, he developed a pacemaker pocket infection  requiring extraction of the device due to methicillin-sensitive staph  aureus in July 2010.  He has done well since that time and now presents  for pacemaker implantation.   DESCRIPTION OF PROCEDURE:  Informed written consent was obtained and the  patient was brought to the electrophysiology lab in a fasting state.  He  was adequately sedated with intravenous Valium as outlined in the  nursing report.  The patient's right chest was prepped and draped in the  usual sterile fashion by the EP lab staff.  The skin overlying the right  deltopectoral region was infiltrated with lidocaine for local analgesia.  A 5-cm incision was made over the right deltopectoral region.  A right  subcutaneous pacemaker pocket was fashioned using a combination of sharp  and blunt dissection.  Electrocautery was used to assure hemostasis.  With fluoroscopic visualization, the right axillary vein was  successfully cannulated without the  requirement of contrast.  Through  the right axillary vein, a Medtronic model [phone] (serial number [id number]  [id number]) right atrial lead and a Medtronic model [phone] (serial number  [id number]) right ventricular lead were advanced with fluoroscopic  visualization into the right lateral atrial free wall and apical right  ventricular septal positions respectively.  Initially mapping was  performed within the right atrial appendage; however, small P-waves with  large far-field R-waves were observed in this location and a high pacing  output was also required.  I therefore elected to place the atrial lead  into the lateral right atrial body.  Initial lead measurements revealed  an atrial lead P-wave of 3.5 mV with an impedance of 654 ohms and a  threshold of 1 volt at 0.5 milliseconds.  The right ventricular lead R-  wave measured 9 mV with an impedance of 788 ohms and a threshold of 0.8  volts at 0.5 milliseconds.  The leads were then secured to the  pectoralis fascia using #2 silk suture over the suture sleeves.  The  leads were then connected to a Medtronic ADAPTA L model [id number] (serial  number [id number]) dual-chamber pacemaker.  The pacemaker pocket was  irrigated with copious gentamicin solution.  The device was then placed  into the pocket.  The pocket was then closed in 2 layers with 2.0 Vicryl  suture for the subcutaneous and subcuticular layers.  Steri-Strips and a  sterile dressing were then applied.  There were no early apparent  complications.   CONCLUSIONS:  1. Successful dual-chamber pacemaker implantation for sinus node      dysfunction.  2. No early apparent complications.      Hillis Range, MD  Electronically Signed     JA/MEDQ  D:  09/29/2008  T:  09/29/2008  Job:  [id number]   cc:   Valetta Mole. Swords, MD

## 2010-07-03 NOTE — Op Note (Signed)
NAMESORIN, [name]                ACCOUNT NO.:  [id number]   MEDICAL RECORD NO.:  [id number]          PATIENT TYPE:  INP   LOCATION:  2023                         FACILITY:  [facility]   PHYSICIAN:  Richard Range, MD       DATE OF BIRTH:  1925-06-17   DATE OF PROCEDURE:  DATE OF DISCHARGE:                               OPERATIVE REPORT   PREPROCEDURE DIAGNOSES:  1. Pacemaker pocket infection.  2. Sinus node dysfunction.  3. Prior syncope.   POSTPROCEDURE DIAGNOSES:  1. Pacemaker pocket infection.  2. Sinus node dysfunction.  3. Prior syncope.   PROCEDURES:  Dual-chamber pacemaker system extraction with revision of  the pocket.   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  This recorded multiple pauses with  symptoms of presyncope.  He, therefore, underwent dual-chamber pacemaker  implantation by me on July 28, 2008.  His implantable loop recorder was  extracted at the same time.  The patient reports doing very well  initially.  He subsequently developed drainage over his implantable loop  recorder removal site.  He reports symptoms of fatigue and low-grade  fevers.  He was evaluated in our office by Dr. Lewayne Diaz and was  noted to have purulent drainage from the implantable loop recorder  removal site.  He had minimal fluctuance within in his pacemaker pocket,  which was without tenderness or significant erythema.  He was therefore  initiated on Keflex.  He returned to the office with prominent erythema  over his pacemaker pocket with tenderness and purulent drainage.  He  therefore presents for dual-chamber pacemaker extraction.   DESCRIPTION OF THE PROCEDURE:  Informed written consent was obtained,  and the patient was brought to the electrophysiology lab in a fasting  state.  He was adequately sedated with intravenous Valium, Versed, and  Fentanyl as outlined in the nursing report.  The patient's left chest  was prepped and draped in the usual sterile fashion by the EP lab staff.  Using a percutaneous Seldinger technique, the left axillary vein was  cannulated with fluoroscopic visualization in an attempt to place a  temporary pacing system from the left chest.  Unfortunately, the skin in  this region was fluctuant and purulent drainage was observed around the  guidewire.  The guidewire was therefore removed.  The patient was  observed to have sinus rhythm at 80 beats per minute on the monitor and  was not presently requiring pacing from his device.  I therefore elected  not to place a temporary pacemaker at this time.  The skin overlying the  existing pacemaker was infiltrated with lidocaine for local analgesia.  A 6-cm incision was then made and copious purulent drainage was  observed.  The wound culture was sent for Gram-stain and culture.  The  pocket was irrigated and the pacemaker was removed from the pocket.  The  pacemaker was detached from the leads.  The leads were then freed from  the  body and the suture sleeves, and all sutures were carefully removed.  The previously placed hemostatic stitch was also found and removed.  The  leads were then unscrewed from the myocardial wall after stylets were  placed into the atrial and ventricular leads.  With fluoroscopic  visualization, the leads were easily removed from the body with gentle  manual traction.  No significant force was required to remove the leads.  Hemostasis was assured with gentle manual pressure.  The leads were  removed in their entirety.  The pocket was then debrided carefully.  Cautery was used to assure hemostasis.  A JP drain was then placed into  the pocket.  The pocket was then closed in 2 layers with 5 separate #1  Prolene sutures.  Attention was then turned to the implantable loop  recorder removal site.  The previously made incision was opened  with  blunt dissection.  A Vicryl suture was identified and was carefully  removed.  The pocket was carefully debrided and irrigated with  gentamicin solution.  The pocket was then closed with a single Prolene  #1 suture.  A sterile dressing was then applied.  There was no early  apparent complications.  The patient was returned to his room.  A  limited bedside transthoracic echocardiogram revealed no pericardial  effusion.  There were no early apparent complications.   CONCLUSIONS:  1. Pacemaker pocket infection.  2. Successful extraction of the patient's Medtronic Adapta L dual-      chamber pacemaker with successful extraction of the atrial and      ventricular leads in their entirety.  3. No early apparent complications.      Richard Range, MD  Electronically Signed     [name]/MEDQ  D:  08/19/2008  T:  08/19/2008  Job:  [id number]   cc:   Richard Mole. Swords, MD

## 2010-07-05 ENCOUNTER — Ambulatory Visit (INDEPENDENT_AMBULATORY_CARE_PROVIDER_SITE_OTHER): Payer: Medicare Other | Admitting: Family Medicine

## 2010-07-05 ENCOUNTER — Encounter: Payer: Self-pay | Admitting: Family Medicine

## 2010-07-05 VITALS — BP 140/70 | Temp 98.4°F | Wt 181.0 lb

## 2010-07-05 DIAGNOSIS — R030 Elevated blood-pressure reading, without diagnosis of hypertension: Secondary | ICD-10-CM

## 2010-07-05 DIAGNOSIS — E538 Deficiency of other specified B group vitamins: Secondary | ICD-10-CM

## 2010-07-05 DIAGNOSIS — R42 Dizziness and giddiness: Secondary | ICD-10-CM

## 2010-07-05 DIAGNOSIS — R21 Rash and other nonspecific skin eruption: Secondary | ICD-10-CM

## 2010-07-05 MED ORDER — CYANOCOBALAMIN 1000 MCG/ML IJ SOLN
1000.0000 ug | Freq: Once | INTRAMUSCULAR | Status: AC
  Administered 2010-07-05: 1000 ug via INTRAMUSCULAR

## 2010-07-05 NOTE — Patient Instructions (Signed)
Continue to monitor blood pressure and follow up with Dr Cato Mulligan if blood pressure consistently over 140/90. Consider lamisil or lotrimen if skin rash not clearing in next couple of weeks.

## 2010-07-05 NOTE — Progress Notes (Signed)
  Subjective:    Patient ID: [name], male    DOB: Nov 09, 1925, 77 y.o.   MRN: [id number]  HPI Patient seen for the following several items  Patient has B12 deficiency. Requesting B12 injection today. Had been scheduled for B12 injection next week. Recent level normal. He has questions regarding oral therapy at this point. No history of any gastric or small bowel surgery. Generally good diet. No vegetarian.  Patient had recent elevated blood pressure at pharmacy 160/80. No headaches. No history of hypertension. No recent dizziness or flushing. No palpitations. Denies any recent chest pains.  Patient relates feeling somewhat run down and fatigued past few days. Two day history of some vertigo worse in the morning and with movement. Symptoms are less today. Denies any recent syncope, headaches, hearing loss, or any focal weakness. Had some mild nausea last night but none today. Does have some nasal congestion. No cough. Denies fever.  Patient has pruritic rash right elbow and buttock region. Slightly raised border with scaly center. Using some type of over-the-counter antifungal with some improvement.   Review of Systems  Constitutional: Negative for fever, chills, activity change and unexpected weight change.  HENT: Positive for congestion.   Respiratory: Negative for cough and shortness of breath.   Cardiovascular: Negative for chest pain, palpitations and leg swelling.  Gastrointestinal: Negative for abdominal pain and abdominal distention.  Genitourinary: Negative for dysuria.  Skin: Positive for rash.  Neurological: Positive for dizziness. Negative for syncope, facial asymmetry, weakness and headaches.  Psychiatric/Behavioral: Negative for confusion.       Objective:   Physical Exam  Constitutional: He is oriented to person, place, and time. He appears well-developed and well-nourished.  HENT:  Head: Normocephalic and atraumatic.  Right Ear: External ear normal.  Left Ear:  External ear normal.  Mouth/Throat: Oropharynx is clear and moist. No oropharyngeal exudate.  Eyes: Pupils are equal, round, and reactive to light.  Neck: Neck supple. No thyromegaly present.  Cardiovascular: Normal rate and regular rhythm.   Pulmonary/Chest: Effort normal and breath sounds normal. No respiratory distress. He has no wheezes. He has no rales.  Musculoskeletal: He exhibits no edema.  Lymphadenopathy:    He has no cervical adenopathy.  Neurological: He is alert and oriented to person, place, and time. No cranial nerve deficit. Coordination normal.       No focal strength deficits.  Skin: Rash noted.       Patient is rash right elbow and buttock region. Slightly raised erythematous border with slightly scaly center.          Assessment & Plan:  #1 vertigo. Suspect peripheral origin. Possible viral related. Nonfocal neuro exam and symptoms mild. Observe for now #2 skin rash. Suspect probable fungal. Try over-the-counter Lamisil or Lotrimin. Touch base with primary if no better in 2 weeks #3 elevated blood pressure. Repeat blood pressure 140/70. Observe for now. #4 B12 deficiency. Injection given today. Patient has questions regarding oral therapy. Suggest discuss with primary physician.

## 2010-07-06 NOTE — Op Note (Signed)
NAMEALTA, SHOBER                ACCOUNT NO.:  [id number]   MEDICAL RECORD NO.:  [id number]          PATIENT TYPE:  AMB   LOCATION:  DSC                          FACILITY:  [facility]   PHYSICIAN:  Thornton Park. Daphine Deutscher, MD  DATE OF BIRTH:  September 22, 1925   DATE OF PROCEDURE:  04/16/2004  DATE OF DISCHARGE:                                 OPERATIVE REPORT   CCS#:  [id number]   PREOPERATIVE DIAGNOSIS:  Left inguinal hernia.   POSTOPERATIVE DIAGNOSIS:  Left indirect inguinal hernia.   PROCEDURE:  Left inguinal herniorrhaphy with mesh.   SURGEON:  Thornton Park. Daphine Deutscher, MD   ANESTHESIA:  General, LMA.   DRAINS:  None.   ESTIMATED BLOOD LOSS:  Minimal.   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  A general  anesthesia was administered.  The area was clipped and prepped with Betadine  and draped sterilely.  An oblique incision was made on the left side and  carried down to the external oblique which were identified and the external  ring was identified.  I opened the fibers and split this down to the  external ring, and then went proximally.  I mobilized the cord and put a  Penrose around it.  I went proximally and dissected free a very large  indirect sac, which I then over-sewed after dissecting it free and cutting  off the excess, oversewing it with a horizontal mattress suture of #2-0  silk.  This retracted up in the abdomen.  I also excised a large lipoma  which I ligated with #4-0 Vicryl.  The floor and ring structures were  reconstructed using a piece of mesh cut, and sutured along the inguinal  ligament with running #2-0 Prolene and around the internal ring, sutured to  itself with #2-0 Prolene horizontal mattress.  It was sutured medially again  with a running #2-0 Prolene.  The external oblique was closed with running  #2-0 Vicryl, and #4-0 Vicryl was used in the subcutaneous and the skin was  closed with running subcuticular #5-0 Vicryl with Benzoin and Steri-Strips.   The patient seemed to tolerate the procedure well.  He will be given  Percocet 5/325 mg, to take for pain.  He will be followed up in the office  in approximately three weeks.  Instructions were given to his wife in the  holding area, as well as written instructions.   CONDITION:  Good.      MBM/MEDQ  D:  04/16/2004  T:  04/16/2004  Job:  [id number]   cc:   Valetta Mole. Swords, M.D. [facility]

## 2010-07-09 ENCOUNTER — Ambulatory Visit: Payer: Medicare Other | Admitting: Internal Medicine

## 2010-07-10 ENCOUNTER — Ambulatory Visit: Payer: Medicare Other | Admitting: Internal Medicine

## 2010-08-02 ENCOUNTER — Ambulatory Visit (INDEPENDENT_AMBULATORY_CARE_PROVIDER_SITE_OTHER): Payer: Medicare Other | Admitting: Internal Medicine

## 2010-08-02 ENCOUNTER — Encounter: Payer: Self-pay | Admitting: Internal Medicine

## 2010-08-02 VITALS — BP 154/80 | HR 84 | Temp 98.3°F | Wt 178.0 lb

## 2010-08-02 DIAGNOSIS — R5381 Other malaise: Secondary | ICD-10-CM

## 2010-08-02 DIAGNOSIS — R5383 Other fatigue: Secondary | ICD-10-CM

## 2010-08-02 LAB — CBC WITH DIFFERENTIAL/PLATELET
Basophils Absolute: 0 10*3/uL (ref 0.0–0.1)
Eosinophils Relative: 1.3 % (ref 0.0–5.0)
HCT: 44.7 % (ref 39.0–52.0)
Hemoglobin: 15.2 g/dL (ref 13.0–17.0)
Lymphs Abs: 1.7 10*3/uL (ref 0.7–4.0)
MCV: 93 fl (ref 78.0–100.0)
Monocytes Absolute: 0.8 10*3/uL (ref 0.1–1.0)
Monocytes Relative: 11.6 % (ref 3.0–12.0)
Neutro Abs: 4.7 10*3/uL (ref 1.4–7.7)
Platelets: 176 10*3/uL (ref 150.0–400.0)
RDW: 13.9 % (ref 11.5–14.6)

## 2010-08-02 LAB — BASIC METABOLIC PANEL
BUN: 19 mg/dL (ref 6–23)
Chloride: 105 mEq/L (ref 96–112)
GFR: 57.1 mL/min — ABNORMAL LOW (ref >60.00)
Glucose, Bld: 97 mg/dL (ref 70–99)
Potassium: 4.6 mEq/L (ref 3.5–5.1)
Sodium: 141 mEq/L (ref 135–145)

## 2010-08-02 LAB — TSH: TSH: 5.7 u[IU]/mL — ABNORMAL HIGH (ref 0.35–5.50)

## 2010-08-02 LAB — HEPATIC FUNCTION PANEL
ALT: 17 U/L (ref 0–53)
AST: 17 U/L (ref 0–37)
Bilirubin, Direct: 0.1 mg/dL (ref 0.0–0.3)
Total Bilirubin: 0.7 mg/dL (ref 0.3–1.2)
Total Protein: 7.1 g/dL (ref 6.0–8.3)

## 2010-08-02 NOTE — Progress Notes (Signed)
  Subjective:     [name] is a 77 y.o. male who presents for evaluation of fatigue. Symptoms began several months ago. The patient feels the fatigue began with: shortly after pacemaker (approx 2.5 years). Symptoms of his fatigue have been hypersomnolence. Patient describes the following psychological symptoms: none. Patient denies change in hair texture, cold intolerance, constipation and GI blood loss. Symptoms have gradually worsened. Symptom severity: moderate. Previous visits for this problem:  Past Medical History  Diagnosis Date  . Bladder neck obstruction   . Syncope and collapse     due to bradycardia/pauses  . Other and unspecified hyperlipidemia   . Gout, unspecified   . Bradycardia   . B12 deficiency    Past Surgical History  Procedure Date  . Inguinal hernia repair 2.27.2006    left w/mesh Dr Valarie Merino   . Pacemaker placement 07/28/2008    (Medtronic Adapta L)  . Pacemaker removal 08/2008    Due to MSSA   . Pacemaker placement 09/2008    re-implantation     reports that he quit smoking about 52 years ago. He does not have any smokeless tobacco history on file. He reports that he drinks alcohol. His drug history not on file. family history includes Cancer in his other; Heart disease in his other; and Stroke in his other. No Known Allergies  .     Review of Systems  patient denies chest pain, shortness of breath, orthopnea. Denies lower extremity edema, abdominal pain, change in appetite, change in bowel movements. Patient denies rashes, musculoskeletal complaints. No other specific complaints in a complete review of systems.    Objective:    well-developed well-nourished male in no acute distress. HEENT exam atraumatic, normocephalic, neck supple without jugular venous distention. Chest clear to auscultation cardiac exam S1-S2 are regular. Abdominal exam overweight with bowel sounds, soft and nontender. Extremities no edema. Neurologic exam is alert with a  normal gait.   Assessment:   Fatigue ? cause  Plan:     Will check labs today I suspect fatigue is related to deconditioning and age

## 2010-08-03 ENCOUNTER — Other Ambulatory Visit: Payer: Self-pay | Admitting: *Deleted

## 2010-08-03 DIAGNOSIS — E039 Hypothyroidism, unspecified: Secondary | ICD-10-CM

## 2010-08-03 MED ORDER — LEVOTHYROXINE SODIUM 50 MCG PO TABS: 50.0000 ug | ORAL_TABLET | Freq: Every day | ORAL | Status: DC

## 2010-09-12 ENCOUNTER — Telehealth: Payer: Self-pay | Admitting: Internal Medicine

## 2010-09-12 DIAGNOSIS — E039 Hypothyroidism, unspecified: Secondary | ICD-10-CM

## 2010-09-12 MED ORDER — DUTASTERIDE 0.5 MG PO CAPS: 0.5000 mg | ORAL_CAPSULE | ORAL | Status: DC

## 2010-09-12 MED ORDER — LEVOTHYROXINE SODIUM 50 MCG PO TABS: 50.0000 ug | ORAL_TABLET | Freq: Every day | ORAL | Status: DC

## 2010-09-12 NOTE — Telephone Encounter (Signed)
Patient would like the generic form of Avodart.  He would like a new script called into [facility] on [address]. Thank you

## 2010-09-12 NOTE — Telephone Encounter (Signed)
No [facility] on [address].  Called pt and its [facility] on [address].  Sent rx in electronically

## 2010-09-13 ENCOUNTER — Telehealth: Payer: Self-pay | Admitting: Internal Medicine

## 2010-09-13 DIAGNOSIS — E039 Hypothyroidism, unspecified: Secondary | ICD-10-CM

## 2010-09-13 NOTE — Telephone Encounter (Signed)
Needs new rx for generic Avodart,levothyroxine sent [facility]

## 2010-09-14 MED ORDER — LEVOTHYROXINE SODIUM 50 MCG PO TABS: 50.0000 ug | ORAL_TABLET | Freq: Every day | ORAL | Status: DC

## 2010-09-14 MED ORDER — DUTASTERIDE 0.5 MG PO CAPS: 0.5000 mg | ORAL_CAPSULE | ORAL | Status: DC

## 2010-09-14 NOTE — Telephone Encounter (Signed)
Sent in to [facility]

## 2010-10-01 ENCOUNTER — Encounter: Payer: Self-pay | Admitting: Internal Medicine

## 2010-10-25 ENCOUNTER — Encounter: Payer: Self-pay | Admitting: Internal Medicine

## 2010-10-25 ENCOUNTER — Ambulatory Visit (INDEPENDENT_AMBULATORY_CARE_PROVIDER_SITE_OTHER): Payer: Medicare Other | Admitting: Internal Medicine

## 2010-10-25 DIAGNOSIS — I498 Other specified cardiac arrhythmias: Secondary | ICD-10-CM

## 2010-10-25 DIAGNOSIS — R001 Bradycardia, unspecified: Secondary | ICD-10-CM

## 2010-10-25 LAB — PACEMAKER DEVICE OBSERVATION
AL AMPLITUDE: 5.6 mv
AL IMPEDENCE PM: 387 Ohm
ATRIAL PACING PM: 49
BATTERY VOLTAGE: 2.8 V
RV LEAD IMPEDENCE PM: 465 Ohm
VENTRICULAR PACING PM: 0

## 2010-10-25 NOTE — Patient Instructions (Signed)
Your physician recommends that you schedule a follow-up appointment in: 6 MONTHS   WITH KRISTIN AND PAULA AND YEAR WITH DR Johney Frame  Your physician recommends that you continue on your current medications as directed. Please refer to the Current Medication list given to you today.

## 2010-10-25 NOTE — Progress Notes (Signed)
The patient presents today for routine electrophysiology followup.  Since last being seen in our clinic, the patient reports doing very well.  He continues to have fatigue but feels that this is improved with thyroid replacement therapy.  Today, he denies symptoms of palpitations, chest pain, shortness of breath, orthopnea, PND, lower extremity edema, dizziness, presyncope, syncope, or neurologic sequela.  The patient feels that he is tolerating medications without difficulties and is otherwise without complaint today.   Past Medical History  Diagnosis Date  . Bladder neck obstruction   . Syncope and collapse     due to bradycardia/pauses  . Other and unspecified hyperlipidemia   . Gout, unspecified   . Bradycardia   . B12 deficiency   . CA in situ bladder   . Hyperlipidemia    Past Surgical History  Procedure Date  . Inguinal hernia repair 2.27.2006    left w/mesh Dr Valarie Merino   . Pacemaker placement 07/28/2008    (Medtronic Adapta L)  . Pacemaker removal 08/2008    Due to MSSA   . Pacemaker placement 09/2008    re-implantation     Current Outpatient Prescriptions  Medication Sig Dispense Refill  . aspirin 325 MG tablet Take 325 mg by mouth daily.        . Cholecalciferol (VITAMIN D-3) 5000 UNITS TABS Take 1 tablet by mouth every other day.       . dutasteride (AVODART) 0.5 MG capsule Take 1 capsule (0.5 mg total) by mouth every other day.  15 capsule  5  . fish oil-omega-3 fatty acids 1000 MG capsule Take 1,400 mg by mouth daily.        Marland Kitchen levothyroxine (SYNTHROID) 50 MCG tablet Take 1 tablet (50 mcg total) by mouth daily.  30 tablet  11   Current Facility-Administered Medications  Medication Dose Route Frequency Provider Last Rate Last Dose  . DISCONTD: cyanocobalamin ((VITAMIN B-12)) injection 1,000 mcg  1,000 mcg Intramuscular Q30 days Judie Petit, MD   1,000 mcg at 06/07/10 1334    No Known Allergies  History   Social History  . Marital Status: Married   Spouse Name: N/A    Number of Children: N/A  . Years of Education: N/A   Occupational History  . RETIRED Pediatrician    Social History Main Topics  . Smoking status: Former Smoker    Quit date: 02/18/1958  . Smokeless tobacco: Not on file  . Alcohol Use: Yes     Rare wine  . Drug Use: Not on file  . Sexually Active: Not on file   Other Topics Concern  . Not on file   Social History Narrative   Regular exercise - YES    Family History  Problem Relation Age of Onset  . Heart disease Other     MI, CHF  . Stroke Other     1st degree relative <50  . Cancer Other     colon    Physical Exam: Filed Vitals:   10/25/10 1503  BP: 122/68  Pulse: 74  Height: 5\' 8"  (1.727 m)  Weight: 175 lb (79.379 kg)    GEN- The patient is well appearing, alert and oriented x 3 today.   Head- normocephalic, atraumatic Eyes-  Sclera clear, conjunctiva pink Ears- hearing intact Oropharynx- clear Neck- supple, no JVP Lymph- no cervical lymphadenopathy Lungs- Clear to ausculation bilaterally, normal work of breathing Chest- pacemaker pocket is well healed Heart- Regular rate and rhythm, no murmurs, rubs or gallops,  PMI not laterally displaced GI- soft, NT, ND, + BS Extremities- no clubbing, cyanosis, or edema MS- no significant deformity or atrophy Skin- no rash or lesion Psych- euthymic mood, full affect Neuro- strength and sensation are intact  Pacemaker interrogation- reviewed in detail today,  See PACEART report  Assessment and Plan:

## 2010-10-25 NOTE — Assessment & Plan Note (Signed)
Normal pacemaker function See Pace Art report No changes today  

## 2010-10-26 ENCOUNTER — Telehealth: Payer: Self-pay | Admitting: Internal Medicine

## 2010-10-26 MED ORDER — FINASTERIDE 5 MG PO TABS: 5.0000 mg | ORAL_TABLET | Freq: Every day | ORAL | Status: DC

## 2010-10-26 NOTE — Telephone Encounter (Signed)
Patient is on Avodart. Would like to changes to the generic Finasteride. Please call in [facility]

## 2010-10-26 NOTE — Telephone Encounter (Signed)
Stop avodart Finasteride 5 mg po qd #30/6 refills

## 2010-10-26 NOTE — Telephone Encounter (Signed)
rx called in, pt aware 

## 2010-10-29 ENCOUNTER — Telehealth: Payer: Self-pay | Admitting: Internal Medicine

## 2010-10-29 NOTE — Telephone Encounter (Signed)
Walk-in-----Pt would like his Finesteride called in to [facility] instead of [facility]. Thanks.

## 2010-10-30 MED ORDER — FINASTERIDE 5 MG PO TABS: 5.0000 mg | ORAL_TABLET | Freq: Every day | ORAL | Status: DC

## 2010-10-30 NOTE — Telephone Encounter (Signed)
rx sent in electronically 

## 2010-11-15 LAB — POCT I-STAT, CHEM 8
BUN: 20
Chloride: 104
Creatinine, Ser: 1.2
Glucose, Bld: 99
Hemoglobin: 13.6
Potassium: 4.3
Sodium: 138

## 2010-11-15 LAB — CK TOTAL AND CKMB (NOT AT ARMC)
CK, MB: 2.6
Relative Index: 2.5
Total CK: 105

## 2010-11-15 LAB — URINALYSIS, ROUTINE W REFLEX MICROSCOPIC
Glucose, UA: NEGATIVE
Hgb urine dipstick: NEGATIVE
Ketones, ur: NEGATIVE
pH: 6.5

## 2010-11-15 LAB — URINE CULTURE: Colony Count: NO GROWTH

## 2010-11-15 LAB — POCT CARDIAC MARKERS
CKMB, poc: 2.1
Myoglobin, poc: 113
Troponin i, poc: <0.05

## 2010-11-15 LAB — CBC
MCHC: 34
RBC: 4.47
WBC: 9.7

## 2010-11-15 LAB — D-DIMER, QUANTITATIVE: D-Dimer, Quant: 0.31

## 2010-11-15 LAB — CARDIAC PANEL(CRET KIN+CKTOT+MB+TROPI)
CK, MB: 2.2
Relative Index: 2
Troponin I: 0.01
Troponin I: <0.01

## 2010-11-15 LAB — DIFFERENTIAL
Basophils Absolute: 0
Eosinophils Relative: 1
Lymphocytes Relative: 26
Lymphs Abs: 2.6
Neutrophils Relative %: 67

## 2010-11-15 LAB — TROPONIN I: Troponin I: 0.01

## 2010-11-21 ENCOUNTER — Ambulatory Visit (INDEPENDENT_AMBULATORY_CARE_PROVIDER_SITE_OTHER): Payer: Medicare Other

## 2010-11-21 DIAGNOSIS — Z23 Encounter for immunization: Secondary | ICD-10-CM

## 2010-12-28 ENCOUNTER — Telehealth: Payer: Self-pay | Admitting: Internal Medicine

## 2010-12-28 NOTE — Telephone Encounter (Signed)
N/A.  LMTC. 

## 2010-12-28 NOTE — Telephone Encounter (Signed)
Pt has a pacemaker and gets fatigued and worn out walking.  Mrs Mcomber wants to know if he can have a handicapped parking permit filled out and signed by Dr Johney Frame?

## 2010-12-28 NOTE — Telephone Encounter (Signed)
Pt's wife has returned your call.  Please call again

## 2010-12-28 NOTE — Telephone Encounter (Signed)
Form was left for Dr Johney Frame.  Please call pt when form is ready if Dr Johney Frame is willing to fill it out.

## 2010-12-28 NOTE — Telephone Encounter (Signed)
Pt wife calling wanting to get RX/order for pt to get a handicap sticker. Pt wife said she can come to the office to pick it up. Please return pt wife call to discuss further.   Alt Number: [phone]

## 2011-01-03 NOTE — Telephone Encounter (Signed)
I have not seen this form. Please give to Tresa Endo so that I can fill it out.

## 2011-01-04 NOTE — Telephone Encounter (Signed)
Form given to Tresa Endo, Dr Allred's nurse for him to fill out.  Mail to pt when finished

## 2011-01-09 NOTE — Telephone Encounter (Signed)
Left message for pt, form placed in the mail to pts home address [name]

## 2011-01-30 ENCOUNTER — Encounter: Payer: Self-pay | Admitting: Neurology

## 2011-01-30 ENCOUNTER — Ambulatory Visit (INDEPENDENT_AMBULATORY_CARE_PROVIDER_SITE_OTHER): Payer: Medicare Other | Admitting: Internal Medicine

## 2011-01-30 ENCOUNTER — Encounter: Payer: Self-pay | Admitting: Internal Medicine

## 2011-01-30 VITALS — BP 126/82 | HR 84 | Temp 98.2°F | Ht 69.0 in | Wt 183.0 lb

## 2011-01-30 DIAGNOSIS — R413 Other amnesia: Secondary | ICD-10-CM

## 2011-01-30 NOTE — Assessment & Plan Note (Addendum)
Reviewed labs Will need to repeat b12, tsh No imaging orders at this time  Reviewed Ct head from 2009.   MMSE in office is perfect. I think there is a disconnect between what wife notices and the MMSE. She is convinced that there is a personality change.  Will ask neurology to review

## 2011-01-30 NOTE — Progress Notes (Signed)
Patient ID: [name], male   DOB: 02-25-1925, 77 y.o.   MRN: [id number] Memory loss---wife states cognitively he is having difficulties (patient is retired Optometrist). He admits to not remembering names, etc... Duration---at least one year.  (After examining the patient and patient having a perfect Mini-Mental Status exam I further questioned the patient and his wife. The patient's wife is more concerned about minor personality changes. She can't think of certain details listed that something is markedly different 2 years ago.)  Reviewed labs---note high tsh---pt self dcd levothyroxine Hx b12 deficiency---noncompliant with b12 shots.  Past Medical History  Diagnosis Date  . Bladder neck obstruction   . Syncope and collapse     due to bradycardia/pauses  . Other and unspecified hyperlipidemia   . Gout, unspecified   . Bradycardia   . B12 deficiency   . CA in situ bladder   . Hyperlipidemia     History   Social History  . Marital Status: Married    Spouse Name: N/A    Number of Children: N/A  . Years of Education: N/A   Occupational History  . RETIRED Pediatrician    Social History Main Topics  . Smoking status: Former Smoker    Quit date: 02/18/1958  . Smokeless tobacco: Not on file  . Alcohol Use: Yes     Rare wine  . Drug Use: Not on file  . Sexually Active: Not on file   Other Topics Concern  . Not on file   Social History Narrative   Regular exercise - YES    Past Surgical History  Procedure Date  . Inguinal hernia repair 2.27.2006    left w/mesh Dr Valarie Merino   . Pacemaker placement 07/28/2008    (Medtronic Adapta L)  . Pacemaker removal 08/2008    Due to MSSA   . Pacemaker placement 09/2008    re-implantation     Family History  Problem Relation Age of Onset  . Heart disease Other     MI, CHF  . Stroke Other     1st degree relative <50  . Cancer Other     colon    No Known Allergies  Current Outpatient Prescriptions on File Prior  to Visit  Medication Sig Dispense Refill  . aspirin 325 MG tablet Take 325 mg by mouth daily.           patient denies chest pain, shortness of breath, orthopnea. Denies lower extremity edema, abdominal pain, change in appetite, change in bowel movements. Patient denies rashes, musculoskeletal complaints. No other specific complaints in a complete review of systems.   BP 126/82  Pulse 84  Temp(Src) 98.2 F (36.8 C) (Oral)  Ht 5\' 9"  (1.753 m)  Wt 183 lb (83.008 kg)  BMI 27.02 kg/m2 MMSE--30/30---missed NOTHING

## 2011-03-14 ENCOUNTER — Ambulatory Visit (INDEPENDENT_AMBULATORY_CARE_PROVIDER_SITE_OTHER): Payer: Medicare Other | Admitting: Neurology

## 2011-03-14 ENCOUNTER — Encounter: Payer: Self-pay | Admitting: Neurology

## 2011-03-14 VITALS — BP 132/70 | HR 76 | Wt 180.0 lb

## 2011-03-14 DIAGNOSIS — F0280 Dementia in other diseases classified elsewhere without behavioral disturbance: Secondary | ICD-10-CM
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
## 2011-03-14 NOTE — Patient Instructions (Signed)
Your appointment for memory testing is Monday, January 28th at 8: 30am at [facility] [address] [place], [place]  [phone]  Your appointment for the CT scan is Tuesday, January 29th at 2:00pm at [address]

## 2011-03-19 ENCOUNTER — Ambulatory Visit (INDEPENDENT_AMBULATORY_CARE_PROVIDER_SITE_OTHER)
Admission: RE | Admit: 2011-03-19 | Discharge: 2011-03-19 | Disposition: A | Discharge status: Home / Self Care | Payer: Medicare Other | Source: Ambulatory Visit | Attending: Neurology | Admitting: Neurology

## 2011-03-19 DIAGNOSIS — F0280 Dementia in other diseases classified elsewhere without behavioral disturbance: Secondary | ICD-10-CM

## 2011-03-23 NOTE — Progress Notes (Signed)
Reviewed CT Head.  Some ex vacuo ventriculomegaly and perhaps some increased hippocampal atrophy.  Does not look like NPH.

## 2011-03-25 ENCOUNTER — Telehealth: Payer: Self-pay | Admitting: Neurology

## 2011-03-25 NOTE — Telephone Encounter (Signed)
Called and spoke with the patient's wife. Informed new follow up appointment scheduled for 04/24/11 at 11:00 am. No other concerns voiced at this time.

## 2011-03-25 NOTE — Telephone Encounter (Signed)
That would be fine.  They can book an appt sometime a couple of weeks after he gets his results from [facility].

## 2011-03-25 NOTE — Telephone Encounter (Signed)
Message copied by [name] on Mon Mar 25, 2011  9:50 AM ------      Message from: Milas Gain      Created: Sat Mar 23, 2011  3:20 PM       please let Dr. [name] know that his CT head looked ok - no obvious cause of his memory loss.

## 2011-03-25 NOTE — Telephone Encounter (Signed)
Called and spoke with the patient's wife. Info given as per Dr. Modesto Charon re: normal head CT. Mrs. Dunnam states he has had the memory test at [facility] and that they are going back for the results on 04/01/11. His next appointment is on 04/25 and she wants to know if Dr. Modesto Charon wants to see them sooner as he mentioned the possibility of putting [name] on a medication after the results of the memory test come back. I told her that all of this could possibly be done via phone but she wanted me ask you about moving the appointment up. I told her I would ask and let her know. **Dr. Modesto Charon, please advise. Thank you.

## 2011-04-24 ENCOUNTER — Encounter: Payer: Self-pay | Admitting: Neurology

## 2011-04-24 ENCOUNTER — Ambulatory Visit (INDEPENDENT_AMBULATORY_CARE_PROVIDER_SITE_OTHER): Payer: Medicare Other | Admitting: Neurology

## 2011-04-24 VITALS — BP 136/80 | HR 88 | Wt 180.0 lb

## 2011-04-24 DIAGNOSIS — F0280 Dementia in other diseases classified elsewhere without behavioral disturbance: Secondary | ICD-10-CM

## 2011-04-24 DIAGNOSIS — G309 Alzheimer's disease, unspecified: Secondary | ICD-10-CM

## 2011-04-24 DIAGNOSIS — F068 Other specified mental disorders due to known physiological condition: Secondary | ICD-10-CM

## 2011-04-24 MED ORDER — DONEPEZIL HCL 10 MG PO TABS: ORAL_TABLET | ORAL | Status: DC

## 2011-04-24 MED ORDER — DONEPEZIL HCL 5 MG PO TABS: ORAL_TABLET | ORAL | Status: DC

## 2011-04-24 NOTE — Progress Notes (Signed)
Pt aware, will call back and schedule OV

## 2011-04-24 NOTE — Progress Notes (Signed)
Dear Dr. Cato Mulligan,  I saw  [name] back in [facility] clinic for his problem with memory.  As you may recall, he is a 78 y.o. year old male with a history of B12 deficiency and hypothyroidism who has had progressive memory problems.  Recent NP testing was consistent with a diagnosis of Alzheimer's dementia.  CT of the head did show mesial temporal atrophy as well.  His memory is about the same.  His wife is worried that he is too withdrawn and she needs to "get him out", which is a struggle for her as she is really only interested in staying at home.  He is content to "watch the birds", but denies depression.  He is not currently taking B12 supplementation as his B12 level was low normal at last check.  He is also not taking levothyroxine but his last TSH was normal.   Medical history, social history, and family history were reviewed and have not changed since the last clinic visit.  Current Outpatient Prescriptions on File Prior to Visit  Medication Sig Dispense Refill  . aspirin 325 MG tablet Take 325 mg by mouth daily.        . finasteride (PROSCAR) 5 MG tablet Take 5 mg by mouth daily.          No Known Allergies  ROS:  13 systems were reviewed and are notable for some mild paranoid, the patient draws the blinds and puts a chair under the door.  He also has hearing loss, although not severe.  They are reserved about getting him hearing aids because they are expensive and he likely will not hear them.  All other review of systems are unremarkable.  Exam: . Filed Vitals:   04/24/11 1056  BP: 136/80  Pulse: 88  Weight: 180 lb (81.647 kg)      Impression/Recommendations:  1.  Memory loss - likely Alzheimer's dementia - I am going to start Aricept 5->10.  He should continue on B12 oral supplementation.  I am hoping the extra acetylcholine helps with his mild paranoia as well. 2.  B12 and TSH - These should probably be checked on at least a 6-12 month basis to make sure he  is properly supplemented.  I will leave this up to you.  We will see the patient back in 3 months.  Lupita Raider Modesto Charon, MD [facility], [place]

## 2011-04-24 NOTE — Progress Notes (Signed)
Call patient---b12 level is modestly low---should have monthly replacement:  b12 IM monthly.

## 2011-04-26 ENCOUNTER — Ambulatory Visit (INDEPENDENT_AMBULATORY_CARE_PROVIDER_SITE_OTHER): Payer: Medicare Other | Admitting: *Deleted

## 2011-04-26 DIAGNOSIS — E538 Deficiency of other specified B group vitamins: Secondary | ICD-10-CM

## 2011-04-26 MED ORDER — CYANOCOBALAMIN 1000 MCG/ML IJ SOLN
1000.0000 ug | Freq: Once | INTRAMUSCULAR | Status: AC
  Administered 2011-04-26: 1000 ug via INTRAMUSCULAR

## 2011-05-23 ENCOUNTER — Other Ambulatory Visit: Payer: Self-pay | Admitting: Neurology

## 2011-05-23 NOTE — Telephone Encounter (Signed)
Pt increased to 10mg  and rx for that was already sent in.

## 2011-05-27 ENCOUNTER — Ambulatory Visit: Payer: Medicare Other | Admitting: Internal Medicine

## 2011-05-29 ENCOUNTER — Ambulatory Visit (INDEPENDENT_AMBULATORY_CARE_PROVIDER_SITE_OTHER): Payer: Medicare Other | Admitting: *Deleted

## 2011-05-29 ENCOUNTER — Encounter: Payer: Self-pay | Admitting: Internal Medicine

## 2011-05-29 DIAGNOSIS — I498 Other specified cardiac arrhythmias: Secondary | ICD-10-CM

## 2011-05-29 DIAGNOSIS — R001 Bradycardia, unspecified: Secondary | ICD-10-CM

## 2011-05-29 LAB — PACEMAKER DEVICE OBSERVATION
AL THRESHOLD: 0.5 V
ATRIAL PACING PM: 43.2
BAMS-0001: 175 {beats}/min
RV LEAD THRESHOLD: 0.875 V
VENTRICULAR PACING PM: 0.4

## 2011-05-29 NOTE — Progress Notes (Signed)
Patient presents for device clinic pacemaker check.  No problems with shortness of breath, chest pain, palpitations, or syncope.  Patient has been diagnosed with early Alzheimers and is being treated with Aricept.  Also has lost around 10 pounds by our scales.  Wife will call PCP for follow up.   Device interrogated and found to be functioning normally.  No changes made today.  See PaceArt report for full details.  Plan ROV with Dr. Johney Frame in 6 months.  Gypsy Balsam, RN, BSN 05/29/2011 4:12 PM

## 2011-06-06 ENCOUNTER — Encounter: Payer: Self-pay | Admitting: *Deleted

## 2011-06-13 ENCOUNTER — Ambulatory Visit: Payer: BC Managed Care – PPO | Admitting: Neurology

## 2011-07-29 ENCOUNTER — Encounter: Payer: Self-pay | Admitting: Neurology

## 2011-07-29 ENCOUNTER — Ambulatory Visit (INDEPENDENT_AMBULATORY_CARE_PROVIDER_SITE_OTHER): Payer: Medicare Other | Admitting: Neurology

## 2011-07-29 VITALS — BP 130/70 | HR 80 | Wt 174.0 lb

## 2011-07-29 DIAGNOSIS — G309 Alzheimer's disease, unspecified: Secondary | ICD-10-CM

## 2011-07-29 DIAGNOSIS — F068 Other specified mental disorders due to known physiological condition: Secondary | ICD-10-CM

## 2011-07-29 DIAGNOSIS — F0280 Dementia in other diseases classified elsewhere without behavioral disturbance: Secondary | ICD-10-CM

## 2011-07-29 MED ORDER — SERTRALINE HCL 25 MG PO TABS: ORAL_TABLET | ORAL | Status: DC

## 2011-07-29 NOTE — Progress Notes (Signed)
Dear Dr. Cato Mulligan,  I saw  [name] back in [facility] clinic for his problem with probable Alzheimer's dementia.  As you may recall, he is a 78 y.o. year old male with a history of B12 deficiency and syncope related to sinus bradycardia.  At his last visit I started him on Aricept.  His wife who gives most of history says that he has not had any side effects from the medication, but has not noticed a significant difference in his memory.  His greatest problem is remembering where he is to put things or where he left things.  He has not had any hallucinations but does seem to have some degree of paranoid.  He puts a chair under his knob of his door.  He cannot say why he does this.  Much of the meeting surrounded his wife's difficulty with dealing with his situation.  Dr. [name] still manages his own medications, but after a mishap where he through out his medications she realizes that she may have to manage these more closely.  She is very busy taking care of his son's children on alternate weeks.  Dr. [name] generally accompanies her everywhere she goes.  He used to be very social but is no longer engaged outside the home -- he denies depression, although does not have the energy he once did.  He is not exercising regularly.  Medical history, social history, and family history were reviewed and have not changed since the last clinic visit.  Current Outpatient Prescriptions on File Prior to Visit  Medication Sig Dispense Refill  . aspirin 325 MG tablet Take 325 mg by mouth daily.        Marland Kitchen donepezil (ARICEPT) 10 MG tablet take 1 tablet at day, start after taking 5mg  tabs daily for 30 days.  30 tablet  3  . finasteride (PROSCAR) 5 MG tablet Take 5 mg by mouth daily.        .      . DISCONTD: donepezil (ARICEPT) 5 MG tablet take 5 mg a day for first 30 days, and then switch to 10mg  tablet.  30 tablet  0    No Known Allergies  ROS:  13 systems were reviewed and are notable for nocturia.  All  other review of systems are unremarkable.  Exam: . Filed Vitals:   07/29/11 1458  BP: 130/70  Pulse: 80  Weight: 174 lb (78.926 kg)    In general, well appearing man.  Impression/Recommendations: 1.  Probable Alzheimer's dementia - continue Aricept 10mg  qhs 2.  Anhedonia/abulia?depression - I would like to start him on sertraline 25mg ->50mg  qam in hope this helps his energy level. 3.  Social supports - I suggested his wife try to get involved in a Alzheimer's support group.    We will see the patient back in 3 months.  Lupita Raider Modesto Charon, MD [facility], [place]

## 2011-09-11 ENCOUNTER — Other Ambulatory Visit: Payer: Self-pay | Admitting: Neurology

## 2011-10-24 ENCOUNTER — Ambulatory Visit: Payer: Medicare Other | Admitting: Neurology

## 2011-10-31 ENCOUNTER — Encounter: Payer: Self-pay | Admitting: Neurology

## 2011-10-31 ENCOUNTER — Ambulatory Visit (INDEPENDENT_AMBULATORY_CARE_PROVIDER_SITE_OTHER): Payer: Medicare Other | Admitting: Neurology

## 2011-10-31 VITALS — BP 130/72 | HR 64 | Wt 167.0 lb

## 2011-10-31 DIAGNOSIS — G309 Alzheimer's disease, unspecified: Secondary | ICD-10-CM

## 2011-10-31 DIAGNOSIS — F0280 Dementia in other diseases classified elsewhere without behavioral disturbance: Secondary | ICD-10-CM

## 2011-10-31 DIAGNOSIS — F068 Other specified mental disorders due to known physiological condition: Secondary | ICD-10-CM

## 2011-10-31 MED ORDER — SERTRALINE HCL 25 MG PO TABS: 75.0000 mg | ORAL_TABLET | Freq: Every day | ORAL | Status: DC

## 2011-10-31 MED ORDER — DONEPEZIL HCL 10 MG PO TABS: 10.0000 mg | ORAL_TABLET | Freq: Every evening | ORAL | Status: DC | PRN

## 2011-10-31 NOTE — Progress Notes (Signed)
   Dear Dr. Cato Mulligan,   I saw Dr. Eden Lathe Peral (PZ) back in [facility] clinic for his problem with probable Alzheimer's dementia. As you may recall, he is a 78 y.o. year old male with a history of B12 deficiency and syncope related to sinus bradycardia. He is on aricept. His wife who gives most of history says that he has not had any side effects from the medication, but has not noticed a significant difference in his memory. His greatest problem is remembering where he is to put things or where he left things.   He has not had any hallucinations but does seem to have some degree of paranoid. He puts a chair under his knob of his door. He cannot say why he does this.   Since I last saw them, he has been stable.  At his last visit I started him on sertraline 50mg  more for his abulia , which really has not provide much benefit.  He sleeps a lot but is not concerned about it, and other than his wife's guilt that he sleep 12-13 hours a day, it gives her time to attend to things around the house.  He is tolerating the donepezil 10mg  daily.   Medical history, social history, and family history were reviewed and have not changed since the last clinic visit. Current Outpatient Prescriptions on File Prior to Visit  Medication Sig Dispense Refill  . aspirin 325 MG tablet Take 325 mg by mouth daily.        Marland Kitchen donepezil (ARICEPT) 10 MG tablet Take 1 tablet (10 mg total) by mouth at bedtime as needed. Take one tablet (10 mg) po q HS.  30 tablet  11  . finasteride (PROSCAR) 5 MG tablet Take 5 mg by mouth daily.        . sertraline (ZOLOFT) 25 MG tablet Take 3 tablets (75 mg total) by mouth daily.  90 tablet  11    No Known Allergies  ROS: 13 systems were reviewed and are notable for nocturia. All other review of systems are unremarkable.  Exam:  Filed Vitals:   10/31/11 1549  BP: 130/72  Pulse: 64     In general, well appearing man.   Mental status 2/5 time; 5/5 place; 1/3  recall.  Impression/Recommendations: 1. Probable Alzheimer's dementia - continue Aricept 10mg  qhs  2. Anhedonia/abulia?depression, fatigue -I will increase his sertraline to 75mg  daily.  The patient will follow up with Dr. Arbutus Leas in 4 months.  Lupita Raider Modesto Charon, MD  [facility], [place]

## 2011-11-14 ENCOUNTER — Telehealth: Payer: Self-pay | Admitting: Internal Medicine

## 2011-11-14 DIAGNOSIS — H547 Unspecified visual loss: Secondary | ICD-10-CM

## 2011-11-14 NOTE — Telephone Encounter (Signed)
Patient's wife called stating that he need a referral faxed to [facility] for a hearing test as he is having difficulty hearing. Fax number [phone]. Please advise.

## 2011-11-14 NOTE — Telephone Encounter (Signed)
Order faxed.

## 2011-11-19 ENCOUNTER — Encounter: Payer: Self-pay | Admitting: *Deleted

## 2011-11-19 DIAGNOSIS — Z950 Presence of cardiac pacemaker: Secondary | ICD-10-CM | POA: Insufficient documentation

## 2011-11-25 ENCOUNTER — Encounter: Payer: Medicare Other | Admitting: Internal Medicine

## 2011-12-02 ENCOUNTER — Encounter: Payer: Self-pay | Admitting: Internal Medicine

## 2011-12-02 ENCOUNTER — Ambulatory Visit (INDEPENDENT_AMBULATORY_CARE_PROVIDER_SITE_OTHER): Payer: Medicare Other | Admitting: Internal Medicine

## 2011-12-02 VITALS — BP 132/68 | HR 72 | Ht 69.0 in | Wt 168.0 lb

## 2011-12-02 DIAGNOSIS — R001 Bradycardia, unspecified: Secondary | ICD-10-CM

## 2011-12-02 DIAGNOSIS — I498 Other specified cardiac arrhythmias: Secondary | ICD-10-CM

## 2011-12-02 LAB — PACEMAKER DEVICE OBSERVATION
AL AMPLITUDE: 560 mv
AL IMPEDENCE PM: 376 Ohm
AL THRESHOLD: 0.625 V
ATRIAL PACING PM: 59
RV LEAD AMPLITUDE: 15.68 mv
RV LEAD IMPEDENCE PM: 482 Ohm

## 2011-12-02 NOTE — Progress Notes (Signed)
PCP: Judie Petit, MD  [name] is a 78 y.o. male who presents today for routine electrophysiology followup.  Since last being seen in our clinic, the patient reports doing reasonably well.  He has stable fatigue and progressive difficulty with memory.  Today, he denies symptoms of palpitations, chest pain, shortness of breath,  lower extremity edema, dizziness, presyncope, or syncope.  The patient is otherwise without complaint today.   Past Medical History  Diagnosis Date  . Bladder neck obstruction   . Syncope and collapse     due to bradycardia/pauses  . Other and unspecified hyperlipidemia   . Gout, unspecified   . Bradycardia   . B12 deficiency   . CA in situ bladder   . Hyperlipidemia    Past Surgical History  Procedure Date  . Inguinal hernia repair 2.27.2006    left w/mesh Dr Valarie Merino   . Pacemaker placement 07/28/2008    (Medtronic Adapta L)  . Pacemaker removal 08/2008    Due to MSSA   . Pacemaker placement 09/2008    re-implantation     Current Outpatient Prescriptions  Medication Sig Dispense Refill  . aspirin 325 MG tablet Take 325 mg by mouth daily.        . cholecalciferol (VITAMIN D) 1000 UNITS tablet Take 1,000 Units by mouth daily.      Marland Kitchen donepezil (ARICEPT) 10 MG tablet Take 1 tablet (10 mg total) by mouth at bedtime as needed. Take one tablet (10 mg) po q HS.  30 tablet  11  . finasteride (PROSCAR) 5 MG tablet Take 5 mg by mouth daily.        . fish oil-omega-3 fatty acids 1000 MG capsule Take 2 g by mouth daily.      . Multiple Vitamin (MULTIVITAMIN) tablet Take 1 tablet by mouth daily.      . sertraline (ZOLOFT) 25 MG tablet Take 3 tablets (75 mg total) by mouth daily.  90 tablet  11    Physical Exam: Filed Vitals:   12/02/11 1609  BP: 149/68  Pulse: 72  Height: 5\' 9"  (1.753 m)  Weight: 168 lb (76.204 kg)    GEN- The patient is well appearing, alert and oriented x 3 today.   Head- normocephalic, atraumatic Eyes-  Sclera clear,  conjunctiva pink Ears- hearing intact Oropharynx- clear Lungs- Clear to ausculation bilaterally, normal work of breathing Chest- pacemaker pocket is well healed Heart- Regular rate and rhythm, no murmurs, rubs or gallops, PMI not laterally displaced GI- soft, NT, ND, + BS Extremities- no clubbing, cyanosis, or edema  Pacemaker interrogation- reviewed in detail today,  See PACEART report  Assessment and Plan:  1. Bradycardia Normal pacemaker function See Pace Art report No changes today

## 2011-12-17 ENCOUNTER — Ambulatory Visit (INDEPENDENT_AMBULATORY_CARE_PROVIDER_SITE_OTHER): Payer: Medicare Other

## 2011-12-17 DIAGNOSIS — Z23 Encounter for immunization: Secondary | ICD-10-CM

## 2011-12-18 DIAGNOSIS — Z23 Encounter for immunization: Secondary | ICD-10-CM

## 2012-03-25 ENCOUNTER — Encounter: Payer: Self-pay | Admitting: Neurology

## 2012-03-25 ENCOUNTER — Ambulatory Visit (INDEPENDENT_AMBULATORY_CARE_PROVIDER_SITE_OTHER): Payer: Medicare Other | Admitting: Neurology

## 2012-03-25 VITALS — BP 108/60 | HR 84 | Temp 98.3°F | Resp 12 | Wt 165.0 lb

## 2012-03-25 DIAGNOSIS — F0280 Dementia in other diseases classified elsewhere without behavioral disturbance: Secondary | ICD-10-CM

## 2012-03-25 DIAGNOSIS — G309 Alzheimer's disease, unspecified: Secondary | ICD-10-CM

## 2012-03-25 NOTE — Patient Instructions (Addendum)
Follow up with Dr. Soo in 6 months. 

## 2012-03-25 NOTE — Progress Notes (Signed)
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 He had some loss of memory as well as a significant change in personality over the preceding months or yearsof  a very gradual onset.  He is to be a bright light in a very outgoing popular person.  He became less outgoing and also he lost the ability to play his instruments.  He also is not has engaged in conversation or reading and he is content to sit in the center and watch the bird feeder.  He is on Aricept which may have stabilized him in the past 12 months, although he has at least slightly progress to that time according to his wife.  Dr. Modesto Charon tried him on Zoloft which does not seem to have made a big difference in his personality.  He does not feel depressed or seem depressed and he is the opposite of agitated.  It has been a big shuttle for his wife who has been married to him for 60 years and they were always a close knit team.  She sorely misses his companionship.  He sleeps 13 or more hours a day.  At this time she can go out for a little while and come back and he did not get into any kind of trouble and he does not wander.  Because he sleeps a lot, he is basically eating 2 meals a day and has lost weight but he looks pretty good.  He still has some nocturia that his wife complains that the patient is not very aware of.  13 point review of symptoms is negative other than the nocturia and the dementia  Past Medical History  Diagnosis Date  . Bladder neck obstruction   . Syncope and collapse     due to bradycardia/pauses  . Other and unspecified hyperlipidemia   . Gout, unspecified   . Bradycardia   . B12 deficiency   . CA in situ bladder   . Hyperlipidemia     Current Outpatient Prescriptions on File Prior to Visit  Medication Sig Dispense Refill  . aspirin 325 MG tablet Take 325 mg by mouth daily.        . cholecalciferol (VITAMIN D)  1000 UNITS tablet Take 1,000 Units by mouth daily.      Marland Kitchen donepezil (ARICEPT) 10 MG tablet Take 1 tablet (10 mg total) by mouth at bedtime as needed. Take one tablet (10 mg) po q HS.  30 tablet  11  . finasteride (PROSCAR) 5 MG tablet Take 5 mg by mouth daily.        . fish oil-omega-3 fatty acids 1000 MG capsule Take 2 g by mouth daily.      . Multiple Vitamin (MULTIVITAMIN) tablet Take 1 tablet by mouth daily.      . sertraline (ZOLOFT) 25 MG tablet Take 3 tablets (75 mg total) by mouth daily.  90 tablet  11   Review of patient's allergies indicates no known allergies.  History   Social History  . Marital Status: Married    Spouse Name: N/A    Number of Children: N/A  . Years of Education: N/A   Occupational History  . RETIRED Pediatrician    Social History Main Topics  . Smoking status: Former Smoker    Quit date: 02/18/1958  . Smokeless tobacco: Never Used  . Alcohol Use: Yes     Comment: small glass of red wine daily  .  Drug Use: No  . Sexually Active: Not on file   Other Topics Concern  . Not on file   Social History Narrative   Regular exercise - YES    Family History  Problem Relation Age of Onset  . Heart disease Other     MI, CHF  . Stroke Other     1st degree relative <50  . Cancer Other     colon    BP 108/60  Pulse 84  Temp 98.3 F (36.8 C)  Resp 12  Wt 165 lb (74.844 kg)   Alert but not oriented to time.  Memory functionis poor for short term recall  Concentration and attention arefairly normal  Speech is fluent but words are few unless prompted.  He has some ability to interpret prverbs with prompt and he can tell time and draw a clock, but he can't place the hands in the clock to indicate 10 past 10.  No carotid bruits detected.  Cranial nerve II through XII are within normal limits.  This includes normal optic discs and acuity, EOMI, PERLA, facial movement and sensation intact, hearing grossly intact, gag intact,Uvula raises symmetrically and  tongue protrudes evenly. Motor strength is 5 over 5 throughout all limbs.  No atrophy, abnormal tone or tremors. Reflexes are 2+ and symmetric in the upper and lower extremities Sensory exam is intact. Coordination is intact for fine movements and rapid alternating movements in all limbs Gait and station are normal.   Impression: Progressive dementia with loss of interest in activities, loss of personality and loss of ability to play his instruments and to draw clock and to remember things such as the date.  This certainly may be alzheimer's and I would think that continuing the aricept is wise.  He does not appear to be depressed, just lacking in his former motivation and abilities.  CT scan shows mild atrophy and no other significant findings.  He has a history of B-12 deficency.  Plan Continue Aricept and fish oil If B-12 as not been checked recently, I would recommend retesting and or put on replacement given the history. Wife needs to seek out counseling Return in 6 months or sooner if needed.

## 2012-06-01 ENCOUNTER — Ambulatory Visit (INDEPENDENT_AMBULATORY_CARE_PROVIDER_SITE_OTHER): Payer: Medicare Other | Admitting: *Deleted

## 2012-06-01 ENCOUNTER — Other Ambulatory Visit: Payer: Self-pay | Admitting: Internal Medicine

## 2012-06-01 DIAGNOSIS — R001 Bradycardia, unspecified: Secondary | ICD-10-CM

## 2012-06-01 DIAGNOSIS — I498 Other specified cardiac arrhythmias: Secondary | ICD-10-CM

## 2012-06-01 LAB — PACEMAKER DEVICE OBSERVATION
AL IMPEDENCE PM: 364 Ohm
AL THRESHOLD: 0.75 V
ATRIAL PACING PM: 63
VENTRICULAR PACING PM: 2

## 2012-06-01 NOTE — Progress Notes (Signed)
PPM check 

## 2012-08-03 ENCOUNTER — Encounter: Payer: Self-pay | Admitting: Internal Medicine

## 2012-09-29 ENCOUNTER — Encounter: Payer: Self-pay | Admitting: Internal Medicine

## 2012-09-30 ENCOUNTER — Telehealth: Payer: Self-pay | Admitting: Neurology

## 2012-09-30 NOTE — Telephone Encounter (Signed)
rec'd records from [facility],Forward 9 page's to Dr.Wong

## 2012-11-25 ENCOUNTER — Ambulatory Visit (INDEPENDENT_AMBULATORY_CARE_PROVIDER_SITE_OTHER): Payer: Medicare Other

## 2012-11-25 DIAGNOSIS — Z23 Encounter for immunization: Secondary | ICD-10-CM

## 2012-12-02 ENCOUNTER — Encounter: Payer: Medicare Other | Admitting: Internal Medicine

## 2012-12-25 ENCOUNTER — Encounter: Payer: Medicare Other | Admitting: Internal Medicine

## 2013-01-17 ENCOUNTER — Encounter (HOSPITAL_COMMUNITY): Payer: Self-pay | Admitting: Emergency Medicine

## 2013-01-17 ENCOUNTER — Emergency Department (HOSPITAL_COMMUNITY)
Admission: EM | Admit: 2013-01-17 | Discharge: 2013-01-17 | Disposition: A | Discharge status: Home / Self Care | Payer: Medicare Other | Attending: Emergency Medicine | Admitting: Emergency Medicine

## 2013-01-17 DIAGNOSIS — Z862 Personal history of diseases of the blood and blood-forming organs and certain disorders involving the immune mechanism: Secondary | ICD-10-CM | POA: Insufficient documentation

## 2013-01-17 DIAGNOSIS — Z79899 Other long term (current) drug therapy: Secondary | ICD-10-CM | POA: Insufficient documentation

## 2013-01-17 DIAGNOSIS — Z8639 Personal history of other endocrine, nutritional and metabolic disease: Secondary | ICD-10-CM | POA: Insufficient documentation

## 2013-01-17 DIAGNOSIS — Z7982 Long term (current) use of aspirin: Secondary | ICD-10-CM | POA: Insufficient documentation

## 2013-01-17 DIAGNOSIS — Z87891 Personal history of nicotine dependence: Secondary | ICD-10-CM | POA: Insufficient documentation

## 2013-01-17 DIAGNOSIS — Z8551 Personal history of malignant neoplasm of bladder: Secondary | ICD-10-CM | POA: Insufficient documentation

## 2013-01-17 DIAGNOSIS — Z87448 Personal history of other diseases of urinary system: Secondary | ICD-10-CM | POA: Insufficient documentation

## 2013-01-17 DIAGNOSIS — Z950 Presence of cardiac pacemaker: Secondary | ICD-10-CM | POA: Insufficient documentation

## 2013-01-17 DIAGNOSIS — R339 Retention of urine, unspecified: Secondary | ICD-10-CM

## 2013-01-17 LAB — URINE MICROSCOPIC-ADD ON

## 2013-01-17 LAB — URINALYSIS, ROUTINE W REFLEX MICROSCOPIC
Bilirubin Urine: NEGATIVE
Glucose, UA: NEGATIVE mg/dL
Ketones, ur: NEGATIVE mg/dL
Leukocytes, UA: NEGATIVE
Nitrite: NEGATIVE
Protein, ur: NEGATIVE mg/dL
Specific Gravity, Urine: 1.02 (ref 1.005–1.030)
Urobilinogen, UA: 0.2 mg/dL (ref 0.0–1.0)
pH: 7 (ref 5.0–8.0)

## 2013-01-17 NOTE — ED Notes (Signed)
Pt c/o urinary retention x 15-20 hours. States he feels as if he needs to go but can't. HX of urinary retention.

## 2013-01-17 NOTE — ED Provider Notes (Signed)
CSN: [id number]     Arrival date & time 01/17/13  1036 History   First MD Initiated Contact with Patient 01/17/13 1047     Chief Complaint  Patient presents with  . 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 Onset yesterday. Last able to void approximately 16 hours ago. Increasing lower, pain. Patient has a past history of urinary retention. Medical history lives bladder neck obstruction and also bladder cancer. Patien and wife unsure of the specifics of this. patient tried to straight cath himself last night without success.  A small amount of blood was noted during this. No fevers or chills. Patient with no other complaints. In on finasteride.   Past Medical History  Diagnosis Date  . Bladder neck obstruction   . Syncope and collapse     due to bradycardia/pauses  . Other and unspecified hyperlipidemia   . Gout, unspecified   . Bradycardia   . B12 deficiency   . CA in situ bladder   . Hyperlipidemia    Past Surgical History  Procedure Laterality Date  . Inguinal hernia repair  2.27.2006    left w/mesh Dr Valarie Merino   . Pacemaker placement  07/28/2008    (Medtronic Adapta L)  . Pacemaker removal  08/2008    Due to MSSA   . Pacemaker placement  09/2008    re-implantation    Family History  Problem Relation Age of Onset  . Heart disease Other     MI, CHF  . Stroke Other     1st degree relative <50  . Cancer Other     colon   History  Substance Use Topics  . Smoking status: Former Smoker    Quit date: 02/18/1958  . Smokeless tobacco: Never Used  . Alcohol Use: Yes     Comment: small glass of red wine daily    Review of Systems  Allergies  Review of patient's allergies indicates no known allergies.  Home Medications   Current Outpatient Rx  Name  Route  Sig  Dispense  Refill  . aspirin 325 MG tablet   Oral   Take 325 mg  by mouth daily.           . cholecalciferol (VITAMIN D) 1000 UNITS tablet   Oral   Take 1,000 Units by mouth daily.         Marland Kitchen donepezil (ARICEPT) 10 MG tablet   Oral   Take 1 tablet (10 mg total) by mouth at bedtime as needed. Take one tablet (10 mg) po q HS.   30 tablet   11   . finasteride (PROSCAR) 5 MG tablet   Oral   Take 5 mg by mouth daily.           . fish oil-omega-3 fatty acids 1000 MG capsule   Oral   Take 1 g by mouth daily.          . Multiple Vitamin (MULTIVITAMIN) tablet   Oral   Take 1 tablet by mouth daily.         . sertraline (ZOLOFT) 25 MG tablet   Oral   Take 3 tablets (75 mg total) by mouth daily.   90 tablet   11    BP 178/89  Pulse 100  Temp(Src) 98.7 F (37.1 C) (Oral)  Resp 20  SpO2 98% Physical Exam  Nursing note and vitals reviewed. Constitutional: He appears well-developed and well-nourished. No distress.  Hard of hearing  HENT:  Head: Normocephalic and atraumatic.  Eyes: Conjunctivae are normal. Right eye exhibits no discharge. Left eye exhibits no discharge.  Neck: Neck supple.  Cardiovascular: Normal rate, regular rhythm and normal heart sounds.  Exam reveals no gallop and no friction rub.   No murmur heard. Pulmonary/Chest: Effort normal and breath sounds normal. No respiratory distress.  Abdominal: Soft. He exhibits mass. He exhibits no distension. There is tenderness.  Patient's bladder is distended and markedly tender to palpation.  Musculoskeletal: He exhibits no edema and no tenderness.  Neurological: He is alert.  Skin: Skin is warm and dry.  Psychiatric: He has a normal mood and affect. His behavior is normal. Thought content normal.    ED Course  Procedures (including critical care time) Labs Review Labs Reviewed  URINALYSIS, ROUTINE W REFLEX MICROSCOPIC - Abnormal; Notable for the following:    Hgb urine dipstick MODERATE (*)    All other components within normal limits  URINE MICROSCOPIC-ADD ON    Imaging Review No results found.  EKG Interpretation   None       MDM   1. Urinary retention     79 year old male with urinary retention. Distended bladder on exam. We'll place a Foley catheter and check urinalysis. Blood noted with attempted catheterization last night. Patient does not describe recent hematuria. Suspect related to trauma during this time attempt. Foley was placed with resolution of abdominal pain and put out quite a bit of urine. Continued foley and urology FU.     Raeford Razor, MD 01/19/13 [phone]

## 2013-01-18 ENCOUNTER — Encounter: Payer: Self-pay | Admitting: Internal Medicine

## 2013-01-18 ENCOUNTER — Ambulatory Visit (INDEPENDENT_AMBULATORY_CARE_PROVIDER_SITE_OTHER): Payer: Medicare Other | Admitting: Internal Medicine

## 2013-01-18 VITALS — BP 112/62 | HR 73 | Ht 69.0 in | Wt 165.1 lb

## 2013-01-18 DIAGNOSIS — I498 Other specified cardiac arrhythmias: Secondary | ICD-10-CM

## 2013-01-18 DIAGNOSIS — Z950 Presence of cardiac pacemaker: Secondary | ICD-10-CM

## 2013-01-18 DIAGNOSIS — R001 Bradycardia, unspecified: Secondary | ICD-10-CM

## 2013-01-18 LAB — MDC_IDC_ENUM_SESS_TYPE_INCLINIC
Battery Impedance: 205 Ohm
Battery Remaining Longevity: 128 mo
Brady Statistic AP VP Percent: 0 %
Brady Statistic AS VS Percent: 31 %
Date Time Interrogation Session: 20141201170654
Lead Channel Impedance Value: 345 Ohm
Lead Channel Impedance Value: 475 Ohm
Lead Channel Pacing Threshold Amplitude: 0.75 V
Lead Channel Pacing Threshold Pulse Width: 0.4 ms
Lead Channel Sensing Intrinsic Amplitude: 4 mV
Lead Channel Setting Pacing Amplitude: 2 V
Lead Channel Setting Pacing Amplitude: 2.5 V
Lead Channel Setting Pacing Pulse Width: 0.4 ms
Lead Channel Setting Sensing Sensitivity: 2.8 mV

## 2013-01-18 NOTE — Patient Instructions (Signed)
Your physician wants you to follow-up in: 6 months in the device clinic and 12 months with Dr Allred You will receive a reminder letter in the mail two months in advance. If you don't receive a letter, please call our office to schedule the follow-up appointment.  

## 2013-01-18 NOTE — Progress Notes (Signed)
PCP: Judie Petit, MD  [name] is a 79 y.o. male who presents today for routine electrophysiology followup.  Since last being seen in our clinic, the patient reports doing reasonably well.  He has stable fatigue and progressive difficulty with memory. His wife says he is more dependant on her everyday.  Today, he denies symptoms of palpitations, chest pain, shortness of breath,  lower extremity edema, dizziness, presyncope, or syncope.  The patient is otherwise without complaint today.   Past Medical History  Diagnosis Date  . Bladder neck obstruction   . Syncope and collapse     due to bradycardia/pauses  . Other and unspecified hyperlipidemia   . Gout, unspecified   . Bradycardia   . B12 deficiency   . CA in situ bladder   . Hyperlipidemia    Past Surgical History  Procedure Laterality Date  . Inguinal hernia repair  2.27.2006    left w/mesh Dr Valarie Merino   . Pacemaker placement  07/28/2008    (Medtronic Adapta L)  . Pacemaker removal  08/2008    Due to MSSA   . Pacemaker placement  09/2008    re-implantation     Current Outpatient Prescriptions  Medication Sig Dispense Refill  . donepezil (ARICEPT) 10 MG tablet Take 10 mg by mouth daily as needed (alzheimer).       No current facility-administered medications for this visit.    Physical Exam: Filed Vitals:   01/18/13 1637  BP: 112/62  Pulse: 73  Height: 5\' 9"  (1.753 m)  Weight: 165 lb 1.9 oz (74.898 kg)    GEN- The patient is well appearing, alert and oriented x 3 today.   Head- normocephalic, atraumatic Eyes-  Sclera clear, conjunctiva pink Ears- hearing intact Oropharynx- clear Lungs- Clear to ausculation bilaterally, normal work of breathing Chest- pacemaker pocket is well healed Heart- Regular rate and rhythm, no murmurs, rubs or gallops, PMI not laterally displaced GI- soft, NT, ND, + BS Extremities- no clubbing, cyanosis, or edema  Pacemaker interrogation- reviewed in detail today,  See  PACEART report ekg today reveals atrial pacing, otherwise normal ekg  Assessment and Plan:  1. Symptomatic bradycardia/ sick sinus syndrome Normal pacemaker function See Pace Art report No changes today  Return to the device clinic in 6 months I will see in a year

## 2013-02-19 ENCOUNTER — Telehealth: Payer: Self-pay | Admitting: Internal Medicine

## 2013-02-19 NOTE — Telephone Encounter (Signed)
Received request from Nurse fax box, documents faxed for surgical clearance. To: [facility] Fax number: [phone] Attention: 1.2.15/kdm

## 2013-02-23 ENCOUNTER — Other Ambulatory Visit: Payer: Self-pay | Admitting: Urology

## 2013-02-26 ENCOUNTER — Encounter: Payer: Self-pay | Admitting: Family Medicine

## 2013-02-26 ENCOUNTER — Ambulatory Visit (INDEPENDENT_AMBULATORY_CARE_PROVIDER_SITE_OTHER): Payer: Medicare Other | Admitting: Family Medicine

## 2013-02-26 VITALS — BP 118/62 | HR 93 | Temp 98.8°F | Wt 165.0 lb

## 2013-02-26 DIAGNOSIS — M109 Gout, unspecified: Secondary | ICD-10-CM

## 2013-02-26 MED ORDER — INDOMETHACIN 50 MG PO CAPS: 50.0000 mg | ORAL_CAPSULE | Freq: Three times a day (TID) | ORAL | Status: DC

## 2013-02-26 NOTE — Progress Notes (Signed)
   Subjective:    Patient ID: [name], male    DOB: [birth year], 80 y.o.   MRN: [id number]  HPI Here for 3 days of swelling and pain in the left great toe. No recent trauma. He thinks it may be gout.    Review of Systems  Constitutional: Negative.   Musculoskeletal: Positive for arthralgias and joint swelling.       Objective:   Physical Exam  Constitutional: He appears well-developed and well-nourished.  In a wheelchair   Musculoskeletal:  The left great toe is swollen at the MTP with erythema and tenderness.           Assessment & Plan:  Elevate the foot prn. Try Indomethacin tid prn.

## 2013-02-26 NOTE — Progress Notes (Signed)
Pre visit review using our clinic review tool, if applicable. No additional management support is needed unless otherwise documented below in the visit note. 

## 2013-03-01 ENCOUNTER — Encounter (HOSPITAL_COMMUNITY): Payer: Self-pay | Admitting: Pharmacy Technician

## 2013-03-04 ENCOUNTER — Encounter (HOSPITAL_COMMUNITY): Payer: Self-pay

## 2013-03-04 ENCOUNTER — Ambulatory Visit (HOSPITAL_COMMUNITY)
Admission: RE | Admit: 2013-03-04 | Discharge: 2013-03-04 | Disposition: A | Discharge status: Home / Self Care | Payer: Medicare Other | Source: Ambulatory Visit | Attending: Urology | Admitting: Urology

## 2013-03-04 ENCOUNTER — Encounter (HOSPITAL_COMMUNITY)
Admission: RE | Admit: 2013-03-04 | Discharge: 2013-03-04 | Disposition: A | Discharge status: Home / Self Care | Payer: Medicare Other | Source: Ambulatory Visit | Attending: Urology | Admitting: Urology

## 2013-03-04 DIAGNOSIS — I700 Atherosclerosis of aorta: Secondary | ICD-10-CM | POA: Insufficient documentation

## 2013-03-04 DIAGNOSIS — Z01812 Encounter for preprocedural laboratory examination: Secondary | ICD-10-CM | POA: Insufficient documentation

## 2013-03-04 DIAGNOSIS — Z01818 Encounter for other preprocedural examination: Secondary | ICD-10-CM | POA: Insufficient documentation

## 2013-03-04 HISTORY — DX: Unspecified hearing loss, unspecified ear: H91.90

## 2013-03-04 HISTORY — DX: Gastro-esophageal reflux disease without esophagitis: K21.9

## 2013-03-04 HISTORY — DX: Unspecified dementia, unspecified severity, without behavioral disturbance, psychotic disturbance, mood disturbance, and anxiety: F03.90

## 2013-03-04 HISTORY — DX: Malignant (primary) neoplasm, unspecified: C80.1

## 2013-03-04 HISTORY — DX: Presence of cardiac pacemaker: Z95.0

## 2013-03-04 LAB — CBC
HEMATOCRIT: 40.9 % (ref 39.0–52.0)
HEMOGLOBIN: 13.8 g/dL (ref 13.0–17.0)
MCH: 30.8 pg (ref 26.0–34.0)
MCHC: 33.7 g/dL (ref 30.0–36.0)
MCV: 91.3 fL (ref 78.0–100.0)
Platelets: 208 10*3/uL (ref 150–400)
RBC: 4.48 MIL/uL (ref 4.22–5.81)
RDW: 13.4 % (ref 11.5–15.5)
WBC: 8.3 10*3/uL (ref 4.0–10.5)

## 2013-03-04 LAB — BASIC METABOLIC PANEL
BUN: 20 mg/dL (ref 6–23)
CALCIUM: 9.1 mg/dL (ref 8.4–10.5)
CO2: 26 meq/L (ref 19–32)
Chloride: 101 mEq/L (ref 96–112)
Creatinine, Ser: 1.2 mg/dL (ref 0.50–1.35)
GFR calc Af Amer: 62 mL/min — ABNORMAL LOW (ref >90)
GFR, EST NON AFRICAN AMERICAN: 53 mL/min — AB (ref >90)
GLUCOSE: 111 mg/dL — AB (ref 70–99)
Potassium: 4.8 mEq/L (ref 3.7–5.3)
Sodium: 139 mEq/L (ref 137–147)

## 2013-03-04 NOTE — Patient Instructions (Signed)
YOUR SURGERY IS SCHEDULED AT [facility]  ON:    Tuesday  1/20  PLEASE BRING LIVING WILL AND HEALTH CARE POWER OF ATTORNEY OR OTHER LEGAL DOCUMENT THAT SAYS WHO CAN SIGN LEGAL PAPERS FOR PATIENT. IF YOU DO NOT HAVE LEGAL PAPERS SHOWING WHO CAN SIGN LEGAL PAPERS - NOTIFY DR. Lyndal Rainbow OFFICE--CALL PAM GIBSON IN [place] AT [phone]  EXT [phone].    REPORT TO  SHORT STAY CENTER AT:  9:00 AM      PHONE # FOR SHORT STAY IS [phone]  DO NOT EAT OR DRINK ANYTHING AFTER MIDNIGHT THE NIGHT BEFORE YOUR SURGERY.  YOU MAY BRUSH YOUR TEETH, RINSE OUT YOUR MOUTH--BUT NO WATER, NO FOOD, NO CHEWING GUM, NO MINTS, NO CANDIES, NO CHEWING TOBACCO.  PLEASE TAKE THE FOLLOWING MEDICATIONS THE AM OF YOUR SURGERY WITH A FEW SIPS OF WATER:  NO MEDS TO TAKE THE AM OF SURGERY.   DO NOT BRING VALUABLES, MONEY, CREDIT CARDS.  DO NOT WEAR JEWELRY, MAKE-UP, NAIL POLISH AND NO METAL PINS OR CLIPS IN YOUR HAIR. CONTACT LENS, DENTURES / PARTIALS, GLASSES SHOULD NOT BE WORN TO SURGERY AND IN MOST CASES-HEARING AIDS WILL NEED TO BE REMOVED.  BRING YOUR GLASSES CASE, ANY EQUIPMENT NEEDED FOR YOUR CONTACT LENS. FOR PATIENTS ADMITTED TO THE HOSPITAL--CHECK OUT TIME THE DAY OF DISCHARGE IS 11:00 AM.  ALL INPATIENT ROOMS ARE PRIVATE - WITH BATHROOM, TELEPHONE, TELEVISION AND WIFI INTERNET.  IF YOU ARE BEING DISCHARGED THE SAME DAY OF YOUR SURGERY--YOU CAN NOT DRIVE YOURSELF HOME--AND SHOULD NOT GO HOME ALONE BY TAXI OR BUS.  NO DRIVING OR OPERATING MACHINERY, DO NOT MAKE LEGAL DECISIONS FOR 24 HOURS FOLLOWING ANESTHESIA / PAIN MEDICATIONS.  PLEASE MAKE ARRANGEMENTS FOR SOMEONE TO BE WITH YOU AT HOME THE FIRST 24 HOURS AFTER SURGERY. RESPONSIBLE DRIVER'S NAME / PHONE [name]  [name] MAY RESULT IN THE CANCELLATION OF YOUR SURGERY. PLEASE BE AWARE THAT YOU MAY NEED ADDITIONAL BLOOD DRAWN DAY OF YOUR  SURGERY  PATIENT SIGNATURE_________________________________

## 2013-03-04 NOTE — Pre-Procedure Instructions (Signed)
EKG AND CARDIOLOGY OFFICE NOTES IN EPIC FROM OFFICE VISIT WITH DR. ALLRED ON 01/18/13. PERIOPERATIVE PRESCRIPTION FOR IMPLANTED CARDIAC DEVICE PROGRAMMING ON PT'S CHART FROM DR. ALLRED - NO DEVICE REPROGRAMMING OR MAGNET PLACEMENT NEEDED.

## 2013-03-08 ENCOUNTER — Encounter (HOSPITAL_COMMUNITY): Payer: Self-pay | Admitting: *Deleted

## 2013-03-08 NOTE — H&P (Signed)
History of Present Illness        F/u - referred Dr. Wilson Singer. His PCP is Dr. Leanne Chang.     1-urinary retention -  -Nov 2014 inability to void Nov 2014. He tried to cath unsuccessfully and saw blood. He went to ER. Ua showed 3-6 rbc. Bladder was distended, foley was bladder.   -Dec 2014 cysto - BPH, no obvious tumor, changes from catheter noted.     2-- bladder cancer - remote history of bladder cancer (1997).   -2012 cystoscopy negative  -Dec 2014 normal cysto    3-BPH - He was started on Avodart for a known large prostate (124 g).   -2011 PSA 1.18 in 2011. He has used Rapaflo and done self cath in the past.   -June 2013 start finasteride.  -June 2014 normal DRE, stopped all meds, no LUTS, normal DRE  -Dec 2014 restart finasteride, start Rapaflo (urinary retention)    He is a retired Lexicographer.      Dec 2014 update  Pt returns to review UDS.  His foley is draining well. He has no fever and he feels well. 1      UDS - I reviewed the report and images - capacity and compliance somwhat diminished (335 ml), bladder stable without leak; no voiding phase. Pt could not void. EMG quit during filling. No voiding phase. PVR 335.     He's with his kids, too. He has loose bowels and bowel incontinence. We discussed typically we would see OAB and incontinence with   dementia (similar to his issues with bowel movement). 1  Also pelvic floor was quiet during   study.  1        1 Amended By: Festus Aloe; Feb 15 2013 4:13 PM EST  Past Medical History Problems  1. History of Alzheimer disease (331.0) 2. History of Bladder neck contracture (596.0) 3. History of Gout (274.9) 4. History of carpal tunnel syndrome (V12.49) 5. History of depression (V11.8) 6. History of kidney stones (V13.01) 7. 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 History of Overactive bladder (596.51) 9. Personal history of bladder cancer (V10.51) 10. History of Urinary  Frequency  Surgical History Problems  1. History of Bladder Surgery 2. History of Holter Monitor 3. History of Inguinal Hernia Repair 4. History of Pacemaker Placement (V53.31)  Current Meds 1. Aspirin 325 MG Oral Tablet;  Therapy: (Recorded:11Jan2008) to Recorded 2. Finasteride 5 MG Oral Tablet; Take 1 tablet daily;  Therapy: [id number] to (Evaluate:27Nov2015)  Requested for: [phone]; Last  Rx:02Dec2014 Ordered 3. Fish Oil CAPS;  Therapy: (Recorded:11Jan2008) to Recorded 4. Multi-Day Vitamins TABS;  Therapy: (Recorded:29Dec2014) to Recorded 5. Tamsulosin HCl - 0.4 MG Oral Capsule; TAKE 1 CAPSULE Daily;  Therapy: [id number] to (Evaluate:10Jan2015); Last Rx:11Dec2014 Ordered 6. Vitamin D-1000 Max St TABS;  Therapy: (Recorded:29Dec2014) to Recorded  Allergies Medication  1. No Known Drug Allergies  Family History Problems  1. Family history of Acute Myocardial Infarction (V17.3) : Mother 2. Family history of Carotid Artery Stenosis : Father 3. Family history of Family Health Status Number Of Children   1 Daughter & 3 Sons 4. Family history of Gout (V18.19) : Father 5. Family history of Heart Disease : Mother  Social History Problems  1. Alcohol Use   rare; red wine 2. Caffeine Use   coffee; 2-5; occasional cola or tea 3. Former smoker (V15.82) 4. Marital History - Currently Married 5. Occupation:   pediatrician 6. Tobacco Use (V15.82)   Quit 44 years ago; age  18 til 1963; one or less per day.  Review of Systems Genitourinary, constitutional, skin, eye, otolaryngeal, hematologic/lymphatic, cardiovascular, pulmonary, endocrine, musculoskeletal, gastrointestinal, neurological and psychiatric system(s) were reviewed and pertinent findings if present are noted.    Vitals Vital Signs [Data Includes: Last 1 Day]  Recorded: 29Dec2014 03:16PM  Height: 5 ft 8 in Weight: 165 lb  BMI Calculated: 25.09 BSA Calculated: 1.88 Blood Pressure: 144 / 79 Temperature: 97.8  F Heart Rate: 87  Physical Exam Constitutional: Well nourished and well developed . No acute distress.  Pulmonary: No respiratory distress and normal respiratory rhythm and effort.  Cardiovascular: Heart rate and rhythm are normal . No peripheral edema.  Neuro/Psych:. Mood and affect are appropriate.    Assessment Assessed  1. Benign prostatic hyperplasia with urinary obstruction (600.01,599.69) 2. Urinary retention (788.20)  Plan Benign prostatic hyperplasia with urinary obstruction, Urinary retention  1. Follow-up Schedule Surgery Office  Follow-up  Status: Hold For - Appointment   Requested for: [phone] Urinary retention  2. Start: Ciprofloxacin HCl - 500 MG Oral Tablet; Take 1 tablet twice daily  Discussion/Summary  I discussed the UDS results. It's likely [name] had trouble following commands and voiding during the testing environment. He came to the office prior to UDS with what sounds like bladder spasm, so hoefully he has some bladder contractions. We discussed nature R/B of chronic foley, learning CIC,  waiting for meds to shrink prostate, 1  SP tube or proceeding with  outlet procedure such as 1  Greenlight (and bladder biopsy vs. turbt). Discussed possible staged procedure. All questions answered. We discussed if he could not void following GL, we would continue foley with cath changes monthly. We discussed risks of procedure failure (pt cant void, still needs foley), irritative symptoms can continue or progress, anesthsia, operative concerns, bleeding, incontinence, stricture scar tissue among others. They want to proceed with Greenlight and bladder biopsy/turbt. We will get clearance from Dr. Rayann Heman.  He's with his kids, too. He has loose bowels and bowel incontinence. We discussed typically we would see OAB and incontinence with dementia (similar to his issues with bowel movement). Also pelvic floor was quiet during study. Again, these things tend to make me lean toward BOO as  the culprit.     They also asked about prostate cancer and we discussed with normal DRE June 2014, lower PSA a few yrs ago and known large prostate, it's likely not PCa, but I will perform a DRE. We also discussed one drawback to GL is no path, but TURP only samples TZ (discussed difference between TZ and PZ). 1      Urine Cx +, therefore start abx three day prior to procedure.        cc: Dr. Leanne Chang; Dr. Rayann Heman      1 Amended By: Festus Aloe; Feb 15 2013 4:13 PM EST  Signatures Electronically signed by : Festus Aloe, M.D.; Feb 15 2013  4:17PM EST

## 2013-03-09 ENCOUNTER — Encounter (HOSPITAL_COMMUNITY): Payer: Self-pay

## 2013-03-09 ENCOUNTER — Ambulatory Visit (HOSPITAL_COMMUNITY)
Admission: RE | Admit: 2013-03-09 | Discharge: 2013-03-09 | Disposition: A | Discharge status: Home / Self Care | Payer: Medicare Other | Source: Ambulatory Visit | Attending: Urology | Admitting: Urology

## 2013-03-09 ENCOUNTER — Ambulatory Visit (HOSPITAL_COMMUNITY): Payer: Medicare Other | Admitting: Anesthesiology

## 2013-03-09 ENCOUNTER — Encounter (HOSPITAL_COMMUNITY)
Admission: RE | Disposition: A | Discharge status: Home / Self Care | Payer: Self-pay | Source: Ambulatory Visit | Attending: Urology

## 2013-03-09 ENCOUNTER — Encounter (HOSPITAL_COMMUNITY): Payer: Medicare Other | Admitting: Anesthesiology

## 2013-03-09 DIAGNOSIS — R339 Retention of urine, unspecified: Secondary | ICD-10-CM | POA: Insufficient documentation

## 2013-03-09 DIAGNOSIS — Z538 Procedure and treatment not carried out for other reasons: Secondary | ICD-10-CM | POA: Insufficient documentation

## 2013-03-09 DIAGNOSIS — N138 Other obstructive and reflux uropathy: Secondary | ICD-10-CM | POA: Insufficient documentation

## 2013-03-09 DIAGNOSIS — N401 Enlarged prostate with lower urinary tract symptoms: Secondary | ICD-10-CM | POA: Insufficient documentation

## 2013-03-09 DIAGNOSIS — R82998 Other abnormal findings in urine: Secondary | ICD-10-CM | POA: Insufficient documentation

## 2013-03-09 SURGERY — GREEN LIGHT LASER TURP (TRANSURETHRAL RESECTION OF PROSTATE
Anesthesia: General

## 2013-03-09 MED ORDER — LACTATED RINGERS IV SOLN: INTRAVENOUS | Status: DC

## 2013-03-09 MED ORDER — BELLADONNA ALKALOIDS-OPIUM 16.2-60 MG RE SUPP
RECTAL | Status: AC
  Filled 2013-03-09: qty 1

## 2013-03-09 MED ORDER — PROPOFOL 10 MG/ML IV BOLUS
INTRAVENOUS | Status: AC
  Filled 2013-03-09: qty 20

## 2013-03-09 MED ORDER — CEFAZOLIN SODIUM-DEXTROSE 2-3 GM-% IV SOLR
INTRAVENOUS | Status: AC
  Filled 2013-03-09: qty 50

## 2013-03-09 MED ORDER — LIDOCAINE HCL (CARDIAC) 20 MG/ML IV SOLN
INTRAVENOUS | Status: AC
  Filled 2013-03-09: qty 5

## 2013-03-09 MED ORDER — FENTANYL CITRATE 0.05 MG/ML IJ SOLN
INTRAMUSCULAR | Status: AC
  Filled 2013-03-09: qty 5

## 2013-03-09 MED ORDER — CEFAZOLIN SODIUM-DEXTROSE 2-3 GM-% IV SOLR: 2.0000 g | INTRAVENOUS | Status: DC

## 2013-03-09 NOTE — Addendum Note (Signed)
Addendum created 03/09/13 1217 by Lollie Sails, CRNA   Modules edited: Anesthesia LDA

## 2013-03-09 NOTE — Interval H&P Note (Signed)
History and Physical Interval Note:  03/09/2013 12:10 PM  [name]  has presented today for surgery, with the diagnosis of BPH URINARY RETENTION   The various methods of treatment have been discussed with the patient and family. After consideration of risks, benefits and other options for treatment, the patient has consented to  Procedure(s): GREEN LIGHT LASER TURP (TRANSURETHRAL RESECTION OF PROSTATE (N/A) as a surgical intervention .  They did not start pre-op Cipro as discussed and I dont think it's wise to perform endoscopic surgery in face of + e coli colonization/+ cx. The catheter needs to be changed. I'll have pt return this week for H&P, catheter change and repeat urine Cx. Reschedule GL PVP with enough time to see urine cx result and start appropriate abx.    [name]

## 2013-03-09 NOTE — Anesthesia Preprocedure Evaluation (Addendum)
Anesthesia Evaluation  Patient identified by MRN, date of birth, ID band Patient awake    Reviewed: Allergy & Precautions, H&P , NPO status , Patient's Chart, lab work & pertinent test results  Airway Mallampati: II TM Distance: >3 FB Neck ROM: Full    Dental  (+) Dental Advisory Given   Pulmonary neg pulmonary ROS, former smoker,  breath sounds clear to auscultation- rhonchi        Cardiovascular negative cardio ROS  + pacemaker Rhythm:Regular Rate:Normal     Neuro/Psych PSYCHIATRIC DISORDERS negative neurological ROS     GI/Hepatic Neg liver ROS, GERD-  ,  Endo/Other  negative endocrine ROS  Renal/GU negative Renal ROS     Musculoskeletal negative musculoskeletal ROS (+)   Abdominal   Peds  Hematology negative hematology ROS (+)   Anesthesia Other Findings   Reproductive/Obstetrics negative OB ROS                          Anesthesia Physical Anesthesia Plan  ASA: III  Anesthesia Plan: General   Post-op Pain Management:    Induction: Intravenous  Airway Management Planned: LMA  Additional Equipment:   Intra-op Plan:   Post-operative Plan: Extubation in OR  Informed Consent: I have reviewed the patients History and Physical, chart, labs and discussed the procedure including the risks, benefits and alternatives for the proposed anesthesia with the patient or authorized representative who has indicated his/her understanding and acceptance.   Dental advisory given  Plan Discussed with: CRNA  Anesthesia Plan Comments:         Anesthesia Quick Evaluation

## 2013-03-09 NOTE — OR Nursing (Signed)
Case cancelled after doing my preop assessment. Adonis Huguenin RN

## 2013-03-10 ENCOUNTER — Other Ambulatory Visit: Payer: Self-pay | Admitting: Urology

## 2013-03-21 ENCOUNTER — Emergency Department (HOSPITAL_COMMUNITY)
Admission: EM | Admit: 2013-03-21 | Discharge: 2013-03-21 | Disposition: A | Discharge status: Home / Self Care | Payer: Medicare Other | Attending: Emergency Medicine | Admitting: Emergency Medicine

## 2013-03-21 ENCOUNTER — Encounter (HOSPITAL_COMMUNITY): Payer: Self-pay | Admitting: Emergency Medicine

## 2013-03-21 DIAGNOSIS — T83091A Other mechanical complication of indwelling urethral catheter, initial encounter: Secondary | ICD-10-CM

## 2013-03-21 DIAGNOSIS — Z8719 Personal history of other diseases of the digestive system: Secondary | ICD-10-CM | POA: Insufficient documentation

## 2013-03-21 DIAGNOSIS — Z8639 Personal history of other endocrine, nutritional and metabolic disease: Secondary | ICD-10-CM | POA: Insufficient documentation

## 2013-03-21 DIAGNOSIS — Z862 Personal history of diseases of the blood and blood-forming organs and certain disorders involving the immune mechanism: Secondary | ICD-10-CM | POA: Insufficient documentation

## 2013-03-21 DIAGNOSIS — Z950 Presence of cardiac pacemaker: Secondary | ICD-10-CM | POA: Insufficient documentation

## 2013-03-21 DIAGNOSIS — Z87891 Personal history of nicotine dependence: Secondary | ICD-10-CM | POA: Insufficient documentation

## 2013-03-21 DIAGNOSIS — Y846 Urinary catheterization as the cause of abnormal reaction of the patient, or of later complication, without mention of misadventure at the time of the procedure: Secondary | ICD-10-CM | POA: Insufficient documentation

## 2013-03-21 DIAGNOSIS — F0390 Unspecified dementia without behavioral disturbance: Secondary | ICD-10-CM | POA: Insufficient documentation

## 2013-03-21 DIAGNOSIS — R339 Retention of urine, unspecified: Secondary | ICD-10-CM | POA: Insufficient documentation

## 2013-03-21 NOTE — ED Notes (Signed)
My thanks to my colleague, Chong Sicilian, who placed a new #16 foley cath.  After gentle irrigation, this new foley drains ~400ML of amber urine with a couple of very sm. Red clots.  Pt. States he feels great relief.

## 2013-03-21 NOTE — ED Notes (Signed)
Pt from home,  Pt wife c/o urinary catheter not draining properly, has blood in it. Pt has dementia and wife is concerned that pt may have pulled on it. Pt in NAD

## 2013-03-21 NOTE — ED Provider Notes (Signed)
CSN: [id number]     Arrival date & time 03/21/13  1623 History   First MD Initiated Contact with Patient 03/21/13 1643     Chief Complaint  Patient presents with  . urinary catheter problem    L5 caveat: Dementia The history is provided by the spouse.   Patient presents with suspected obstructed urinary catheter.  No urine output today.  Patient complains of pressure in his lower abdomen.  No recent vomiting or diarrhea.  Patient's wife is concerned that the patient may pulled on it.  Patient has a history of dementia and therefore cannot provide additional information.   Past Medical History  Diagnosis Date  . Bladder neck obstruction     HAS FOLEY CATHETER  . Syncope and collapse     due to bradycardia/pauses  . Other and unspecified hyperlipidemia   . Gout, unspecified     GOUT FLARE UP LEFT FOOT ON 02/22/13 - RESOLVED AFTER TAKING 2 DOSES OF INDOCIN  . Bradycardia   . B12 deficiency   . CA in situ bladder   . Hyperlipidemia   . Pacemaker   . GERD (gastroesophageal reflux disease)     RARE - WOULD TAKE TUMS IF NEEDED  . Cancer     PAST HX OF POLYPS IN BLADDER - REMOVED - WIFE STATES NOT SURE IF CANCEROUS  . Problems with hearing     WEARS RIGHT HEARING AID  . Dementia     HAS MEMORY PROBLEMS -DEPENDS ON HIS WIFE TO HELP HIM ANSWER QUESTIONS   Past Surgical History  Procedure Laterality Date  . Inguinal hernia repair  2.27.2006    left w/mesh Dr Pedro Earls   . Pacemaker placement  07/28/2008    (Medtronic Adapta L)  . Pacemaker removal  08/2008    Due to MSSA   . Pacemaker placement  09/2008    re-implantation    Family History  Problem Relation Age of Onset  . Heart disease Other     MI, CHF  . Stroke Other     1st degree relative <50  . Cancer Other     colon   History  Substance Use Topics  . Smoking status: Former Smoker    Quit date: 02/18/1958  . Smokeless tobacco: Never Used  . Alcohol Use: Yes     Comment: small glass of red wine  - RARELY     Review of Systems  Unable to perform ROS   Allergies  Review of patient's allergies indicates no known allergies.  Home Medications   Current Outpatient Rx  Name  Route  Sig  Dispense  Refill  . ciprofloxacin (CIPRO) 500 MG tablet   Oral   Take 500 mg by mouth 2 (two) times daily. FOR 7 DAY COURSE         . donepezil (ARICEPT) 10 MG tablet   Oral   Take 10 mg by mouth daily as needed (alzheimer).         . finasteride (PROSCAR) 5 MG tablet   Oral   Take 5 mg by mouth daily. AT BEDTIME         . indomethacin (INDOCIN) 50 MG capsule   Oral   Take 50 mg by mouth daily as needed (gout).         . tamsulosin (FLOMAX) 0.4 MG CAPS capsule   Oral   Take 0.4 mg by mouth daily. AT BEDTIME          BP 182/79  Pulse 78  Temp(Src) 98.3 F (36.8 C) (Oral)  Resp 20  SpO2 98% Physical Exam  Nursing note and vitals reviewed. Constitutional: He appears well-developed and well-nourished.  HENT:  Head: Normocephalic and atraumatic.  Eyes: EOM are normal.  Neck: Normal range of motion.  Cardiovascular: Normal rate and regular rhythm.   Pulmonary/Chest: Effort normal.  Abdominal: Soft.  Musculoskeletal: Normal range of motion.  Neurological: He is alert.  Skin: Skin is warm and dry.  Psychiatric: He has a normal mood and affect. Judgment normal.    ED Course  Procedures (including critical care time) Labs Review Labs Reviewed  URINE CULTURE     Imaging Review No results found.  EKG Interpretation   None       MDM   1. Urinary retention   2. Obstructed Foley catheter    Catheter change.  Urine draining.  Patient feels much better at this time.  Urine culture sent.  Outpatient PCP and urology followup.    Hoy Morn, MD 03/21/13 [phone]

## 2013-03-23 LAB — URINE CULTURE: Colony Count: >=100000

## 2013-03-26 ENCOUNTER — Encounter (HOSPITAL_COMMUNITY): Payer: Self-pay | Admitting: *Deleted

## 2013-03-26 NOTE — Progress Notes (Addendum)
Did phone interview with wife to get health history and review medications states that she is going to follow instructions from doctor's office,not taking any medication the morning of surgery.

## 2013-03-29 ENCOUNTER — Encounter (HOSPITAL_COMMUNITY): Payer: Self-pay | Admitting: Emergency Medicine

## 2013-03-29 ENCOUNTER — Emergency Department (HOSPITAL_COMMUNITY)
Admission: EM | Admit: 2013-03-29 | Discharge: 2013-03-30 | Disposition: A | Discharge status: Home / Self Care | Payer: Medicare Other | Attending: Emergency Medicine | Admitting: Emergency Medicine

## 2013-03-29 ENCOUNTER — Emergency Department (HOSPITAL_COMMUNITY): Payer: Medicare Other

## 2013-03-29 DIAGNOSIS — N289 Disorder of kidney and ureter, unspecified: Secondary | ICD-10-CM | POA: Insufficient documentation

## 2013-03-29 DIAGNOSIS — M109 Gout, unspecified: Secondary | ICD-10-CM | POA: Insufficient documentation

## 2013-03-29 DIAGNOSIS — F0390 Unspecified dementia without behavioral disturbance: Secondary | ICD-10-CM | POA: Insufficient documentation

## 2013-03-29 DIAGNOSIS — Z87891 Personal history of nicotine dependence: Secondary | ICD-10-CM | POA: Insufficient documentation

## 2013-03-29 DIAGNOSIS — H9190 Unspecified hearing loss, unspecified ear: Secondary | ICD-10-CM | POA: Insufficient documentation

## 2013-03-29 DIAGNOSIS — Z79899 Other long term (current) drug therapy: Secondary | ICD-10-CM | POA: Insufficient documentation

## 2013-03-29 DIAGNOSIS — Z87448 Personal history of other diseases of urinary system: Secondary | ICD-10-CM | POA: Insufficient documentation

## 2013-03-29 DIAGNOSIS — Z950 Presence of cardiac pacemaker: Secondary | ICD-10-CM | POA: Insufficient documentation

## 2013-03-29 DIAGNOSIS — Z792 Long term (current) use of antibiotics: Secondary | ICD-10-CM | POA: Insufficient documentation

## 2013-03-29 DIAGNOSIS — Z8639 Personal history of other endocrine, nutritional and metabolic disease: Secondary | ICD-10-CM | POA: Insufficient documentation

## 2013-03-29 DIAGNOSIS — Z791 Long term (current) use of non-steroidal anti-inflammatories (NSAID): Secondary | ICD-10-CM | POA: Insufficient documentation

## 2013-03-29 DIAGNOSIS — R197 Diarrhea, unspecified: Secondary | ICD-10-CM | POA: Insufficient documentation

## 2013-03-29 DIAGNOSIS — E860 Dehydration: Secondary | ICD-10-CM

## 2013-03-29 DIAGNOSIS — Z8551 Personal history of malignant neoplasm of bladder: Secondary | ICD-10-CM | POA: Insufficient documentation

## 2013-03-29 LAB — URINALYSIS, ROUTINE W REFLEX MICROSCOPIC
Bilirubin Urine: NEGATIVE
GLUCOSE, UA: NEGATIVE mg/dL
Ketones, ur: NEGATIVE mg/dL
Nitrite: NEGATIVE
Protein, ur: 30 mg/dL — AB
Specific Gravity, Urine: 1.024 (ref 1.005–1.030)
UROBILINOGEN UA: 0.2 mg/dL (ref 0.0–1.0)
pH: 5 (ref 5.0–8.0)

## 2013-03-29 LAB — URINE MICROSCOPIC-ADD ON

## 2013-03-29 LAB — COMPREHENSIVE METABOLIC PANEL
ALT: 12 U/L (ref 0–53)
AST: 17 U/L (ref 0–37)
Albumin: 3.5 g/dL (ref 3.5–5.2)
Alkaline Phosphatase: 58 U/L (ref 39–117)
BUN: 37 mg/dL — ABNORMAL HIGH (ref 6–23)
CO2: 20 meq/L (ref 19–32)
Calcium: 8.6 mg/dL (ref 8.4–10.5)
Chloride: 102 mEq/L (ref 96–112)
Creatinine, Ser: 1.5 mg/dL — ABNORMAL HIGH (ref 0.50–1.35)
GFR calc Af Amer: 47 mL/min — ABNORMAL LOW (ref >90)
GFR, EST NON AFRICAN AMERICAN: 41 mL/min — AB (ref >90)
Glucose, Bld: 144 mg/dL — ABNORMAL HIGH (ref 70–99)
POTASSIUM: 3.9 meq/L (ref 3.7–5.3)
SODIUM: 137 meq/L (ref 137–147)
Total Bilirubin: 0.3 mg/dL (ref 0.3–1.2)
Total Protein: 7.8 g/dL (ref 6.0–8.3)

## 2013-03-29 LAB — CBC WITH DIFFERENTIAL/PLATELET
Basophils Absolute: 0 10*3/uL (ref 0.0–0.1)
Basophils Relative: 0 % (ref 0–1)
Eosinophils Absolute: 0 10*3/uL (ref 0.0–0.7)
Eosinophils Relative: 0 % (ref 0–5)
HEMATOCRIT: 47.3 % (ref 39.0–52.0)
Hemoglobin: 15.9 g/dL (ref 13.0–17.0)
LYMPHS PCT: 12 % (ref 12–46)
Lymphs Abs: 1.6 10*3/uL (ref 0.7–4.0)
MCH: 30.9 pg (ref 26.0–34.0)
MCHC: 33.6 g/dL (ref 30.0–36.0)
MCV: 91.8 fL (ref 78.0–100.0)
Monocytes Absolute: 1.4 10*3/uL — ABNORMAL HIGH (ref 0.1–1.0)
Monocytes Relative: 11 % (ref 3–12)
NEUTROS ABS: 9.9 10*3/uL — AB (ref 1.7–7.7)
Neutrophils Relative %: 77 % (ref 43–77)
PLATELETS: 223 10*3/uL (ref 150–400)
RBC: 5.15 MIL/uL (ref 4.22–5.81)
RDW: 14.1 % (ref 11.5–15.5)
WBC: 12.9 10*3/uL — AB (ref 4.0–10.5)

## 2013-03-29 LAB — LIPASE, BLOOD: Lipase: 22 U/L (ref 11–59)

## 2013-03-29 MED ORDER — IOHEXOL 300 MG/ML  SOLN
80.0000 mL | Freq: Once | INTRAMUSCULAR | Status: AC | PRN
  Administered 2013-03-29: 80 mL via INTRAVENOUS

## 2013-03-29 MED ORDER — SODIUM CHLORIDE 0.9 % IV BOLUS (SEPSIS)
500.0000 mL | Freq: Once | INTRAVENOUS | Status: AC
  Administered 2013-03-29: 500 mL via INTRAVENOUS

## 2013-03-29 MED ORDER — IOHEXOL 300 MG/ML  SOLN
50.0000 mL | Freq: Once | INTRAMUSCULAR | Status: AC | PRN
  Administered 2013-03-29: 50 mL via ORAL

## 2013-03-29 NOTE — H&P (Signed)
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 has the following GU history:     1-urinary retention -  -Nov 2014 inability to void Nov 2014. He tried to cath unsuccessfully and saw blood. He went to ER. Ua showed 3-6 rbc. Bladder was distended, foley was placed in bladder.   -Dec 2014 cysto - BPH, no obvious tumor, changes from catheter noted.   -Dec 2014- UDS- capacity and compliance somewhat diminished (335 ml), bladder stable without leak; no voiding phase. Pt could not void. EMG quit during filling. No voiding phase. PVR 335.  - Jan 2015- scheduled for greenlight TURP- rescheduled for 03/30/13    2-- bladder cancer - remote history of bladder cancer (1997).   -2012 cystoscopy negative  -Dec 2014 normal cysto    3-BPH - He was started on Avodart for a known large prostate (124 g).   -2011 PSA 1.18 in 2011. He has used Rapaflo and has performed self cath in the past.   -June 2013 start finasteride.  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 He suffers from dementia.     Interval history: [name] presents today for a pre op appointment and a catheter change. He is doing well today. He is tolerating his catheter well. It really does not cause him any discomfort. It has been draining well. His family, who is with him today, has noticed that he has had some leaking of urine around the meatus but that certainly most of the urine has gone into the foley bag. It has been clear and without hematuria. He denies n/v or f/c.   Past Medical History Problems  1. History of Alzheimer disease (331.0) 2. History of Bladder neck contracture (596.0) 3. History of Gout (274.9) 4. History of carpal  tunnel syndrome (V12.49) 5. History of depression (V11.8) 6. History of kidney stones (V13.01) 7. 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 History of Overactive bladder (596.51) 9. Personal history of bladder cancer (V10.51) 10. History of Urinary Frequency  Surgical History Problems  1. History of Bladder Surgery 2. History of Holter Monitor 3. History of Inguinal Hernia Repair 4. History of Pacemaker Placement (V53.31)  Current Meds 1. Finasteride 5 MG Oral Tablet; Take 1 tablet daily;  Therapy: [id number] to (Evaluate:27Nov2015)  Requested for: [id number]; Last  Rx:02Dec2014 Ordered 2. Indomethacin 50 MG Oral Capsule;  Therapy: [id number] to Recorded 3. Tamsulosin HCl - 0.4 MG Oral Capsule; TAKE 1 CAPSULE Daily;  Therapy: [id number] to (Evaluate:10Jan2015); Last Rx:11Dec2014 Ordered  Allergies Medication  1. No Known Drug Allergies  Family History Problems  1. Family history of Acute Myocardial Infarction (V17.3) : Mother 2. Family history of Carotid Artery Stenosis : Father 3. Family history of Family Health Status Number Of Children   1 Daughter & 3 Sons 4. Family history of Gout (V18.19) : Father 5. Family history of Heart Disease : Mother  Social History Problems  1. Alcohol Use   rare; red wine 2. Caffeine Use   coffee; 2-5; occasional cola or tea 3. Former smoker (V15.82) 4. Marital History - Currently Married 5. Occupation:   pediatrician 6. Tobacco Use (V15.82)   Quit 44 years ago; age 85 til 23; one or less per day.  Review of Systems Genitourinary, constitutional and gastrointestinal system(s) were reviewed and pertinent findings if present are noted.  Genitourinary: incomplete emptying of bladder.    Vitals Vital Signs [Data Includes: Last 1 Day]  Recorded: 22Jan2015 01:21PM  Blood Pressure: 128 / 71 Temperature: 97.8 F Heart Rate: 87  Physical Exam Constitutional: Well nourished and well developed . No acute distress.  Pulmonary:  No respiratory distress and normal respiratory rhythm and effort.  Cardiovascular: Heart rate and rhythm are normal . The arterial pulses are normal.  Abdomen: The abdomen is soft and nontender. No CVA tenderness.  Genitourinary: Examination of the penis demonstrates an indwelling catheter.    Procedure Dr. Trish Fountain existing catheter was removed. Using sterile technique a new 16 fr foley catheter was inserted into his bladder. 10 cc of sterile water was used to inflate the balloon. A sample of urine was obtained from the bladder and sent to the lab for a culture. The catheter was attached to a leg bag and noted to be draining well. He tolerated this well.   Assessment Assessed  1. Urinary retention (788.20)  [name] is doing well today and is prepared for his upcoming greenlight procedure. His catheter was changed today and a sample of urine was obtained from the bladder. This was sent to the lab for a culture. I told the family that pending the culture report I will prescribe an antibiotic that he will begin three days prior to the procedure. I would expect his culture to be positive but this is likely related to contamination from the catheter and just needs to be treated prior to the TURP. They voiced understanding. All other questions were answered. [name] will proceed accordingly.   Plan Cloudy urine  1. URINE CULTURE; Status:Hold For - Specimen/Data Collection,Appointment; Requested  for:22Jan2015;   1. Culture urine- call with results and pre op antibiotics  2. Follow up as scheduled for green light TURP   Signatures Electronically signed by : Anders Grant, NP-C; Mar 12 2013  8:38AM EST

## 2013-03-29 NOTE — ED Notes (Addendum)
Foley irrigated w/ 60 cc Saline, no difficulty when pushing fluid, no pain.

## 2013-03-29 NOTE — ED Provider Notes (Signed)
CSN: [id number]     Arrival date & time 03/29/13  1621 History   First MD Initiated Contact with Patient 03/29/13 1803     Chief Complaint  Patient presents with  . Diarrhea     (Consider location/radiation/quality/duration/timing/severity/associated sxs/prior Treatment) HPI A LEVEL 5 CAVEAT PERTAINS DUE TO DEMENTIA Pt presenting with c/o diarrhea and generalized weakness over the past 3 days.  Pt has been on ciprofloxacin for the past 3 days as prophylaxis for having laser surgery on prostate.  No emesis.  Pt has been drinking increased fluid. Family has noted decreased urine output.  Family is concerned about whether he will be able to have the procedure scheduled for tomorrow.   Past Medical History  Diagnosis Date  . Bladder neck obstruction     HAS FOLEY CATHETER  . Syncope and collapse     due to bradycardia/pauses  . Other and unspecified hyperlipidemia   . Gout, unspecified     GOUT FLARE UP LEFT FOOT ON 02/22/13 - RESOLVED AFTER TAKING 2 DOSES OF INDOCIN  . Bradycardia   . B12 deficiency   . CA in situ bladder   . Hyperlipidemia   . Pacemaker   . GERD (gastroesophageal reflux disease)     RARE - WOULD TAKE TUMS IF NEEDED  . Problems with hearing     WEARS RIGHT HEARING AID  . Dementia     HAS MEMORY PROBLEMS -DEPENDS ON HIS WIFE TO HELP HIM ANSWER QUESTIONS  . Cancer     PAST HX OF POLYPS IN BLADDER - REMOVED - WIFE STATES NOT SURE IF CANCEROUS   Past Surgical History  Procedure Laterality Date  . Inguinal hernia repair  2.27.2006    left w/mesh Dr Valarie Merino   . Pacemaker placement  07/28/2008    ([employer] Adapta L)  . Pacemaker removal  08/2008    Due to MSSA   . Pacemaker placement  09/2008    re-implantation   . Hernia repair     Family History  Problem Relation Age of Onset  . Heart disease Other     MI, CHF  . Stroke Other     1st degree relative <50  . Cancer Other     colon   History  Substance Use Topics  . Smoking status: Former Smoker     Quit date: 02/18/1958  . Smokeless tobacco: Never Used  . Alcohol Use: Yes     Comment: small glass of red wine  - RARELY    Review of Systems UNABLE TO OBTAIN ROS DUE TO LEVEL 5 CAVEAT    Allergies  Review of patient's allergies indicates no known allergies.  Home Medications   Current Outpatient Rx  Name  Route  Sig  Dispense  Refill  . donepezil (ARICEPT) 10 MG tablet   Oral   Take 10 mg by mouth daily as needed (alzheimer).         . finasteride (PROSCAR) 5 MG tablet   Oral   Take 5 mg by mouth daily. AT BEDTIME         . indomethacin (INDOCIN) 50 MG capsule   Oral   Take 50 mg by mouth daily as needed (gout).         . tamsulosin (FLOMAX) 0.4 MG CAPS capsule   Oral   Take 0.4 mg by mouth daily. AT BEDTIME         . ciprofloxacin (CIPRO) 500 MG tablet   Oral   Take  500 mg by mouth 2 (two) times daily. FOR 7 DAY COURSE          BP 168/75  Pulse 82  Temp(Src) 99.1 F (37.3 C) (Oral)  Resp 16  SpO2 99% Vitals reviewed Physical Exam Physical Examination: General appearance - alert, well appearing, and in no distress Mental status - alert, oriented to person, place, and time Eyes - no conjunctival injection, no scleral icterus Mouth - mucous membranes moist, pharynx normal without lesions Chest - clear to auscultation, no wheezes, rales or rhonchi, symmetric air entry Heart - normal rate, regular rhythm, normal S1, S2, no murmurs, rubs, clicks or gallops Abdomen - soft, nontender, nondistended, no masses or organomegaly GU Male - no penile lesions or discharge, foley in place Extremities - peripheral pulses normal, no pedal edema, no clubbing or cyanosis Skin - normal coloration and turgor, no rashes  ED Course  Procedures (including critical care time)  11:38 PM d/w Dr. Junious Silk, urology, he feels it is best to cancel surgery for tomorrow as this is an elective procedure.  States he can hold on cipro, that he had talked with family that as  long as he has foley in place he would not treat the chronic UTI, that cipro was for prophylaxis in preparation for surgery.  Pt has had no further diarrhea while in the ED.  Has increased urine output, and full foley bag upon discharge.   Labs Review Labs Reviewed  URINALYSIS, ROUTINE W REFLEX MICROSCOPIC - Abnormal; Notable for the following:    APPearance CLOUDY (*)    Hgb urine dipstick SMALL (*)    Protein, ur 30 (*)    Leukocytes, UA SMALL (*)    All other components within normal limits  CBC WITH DIFFERENTIAL - Abnormal; Notable for the following:    WBC 12.9 (*)    Neutro Abs 9.9 (*)    Monocytes Absolute 1.4 (*)    All other components within normal limits  COMPREHENSIVE METABOLIC PANEL - Abnormal; Notable for the following:    Glucose, Bld 144 (*)    BUN 37 (*)    Creatinine, Ser 1.50 (*)    GFR calc non Af Amer 41 (*)    GFR calc Af Amer 47 (*)    All other components within normal limits  URINE MICROSCOPIC-ADD ON - Abnormal; Notable for the following:    Bacteria, UA MANY (*)    Casts HYALINE CASTS (*)    All other components within normal limits  URINE CULTURE  STOOL CULTURE  CLOSTRIDIUM DIFFICILE BY PCR  LIPASE, BLOOD   Imaging Review Ct Abdomen Pelvis W Contrast  03/29/2013   CLINICAL DATA:  Diarrhea for 24 hr.  EXAM: CT ABDOMEN AND PELVIS WITH CONTRAST  TECHNIQUE: Multidetector CT imaging of the abdomen and pelvis was performed using the standard protocol following bolus administration of intravenous contrast.  CONTRAST:  51mL OMNIPAQUE IOHEXOL 300 MG/ML SOLN, 36mL OMNIPAQUE IOHEXOL 300 MG/ML SOLN  COMPARISON:  None.  FINDINGS: There is a 4 mm low density in the posterior segment right lobe liver too small to characterize, probably a cyst. The liver is otherwise normal. There are gallstones within the gallbladder. There is no peri gallbladder inflammation. The spleen, pancreas, adrenal glands and kidneys are normal. There is atherosclerosis of the abdominal aorta  without aneurysmal dilatation. There is no hydronephrosis bilaterally. There is no abdominal lymphadenopathy. There is a moderate hiatal hernia. There is no small bowel obstruction or diverticulitis. There is diverticulosis of the  colon. The appendix is normal.  A catheter is identified within the bladder. Right inguinal herniation mesenteric fat is identified. There is mild patchy opacity of the posterior right lung base more likely due to atelectasis, less likely pneumonia. Degenerative joint changes of the spine are noted.  IMPRESSION: No acute abnormality identified in the abdomen and pelvis. There is no evidence of bowel obstruction or diverticulitis.   Electronically Signed   By: Abelardo Diesel M.D.   On: 03/29/2013 23:00   Dg Abd Acute W/chest  03/29/2013   CLINICAL DATA:  Diarrhea.  Hiccups.  EXAM: ACUTE ABDOMEN SERIES (ABDOMEN 2 VIEW & CHEST 1 VIEW)  COMPARISON:  PA and lateral chest 03/04/2013 and 09/30/2008.  FINDINGS: Single view of the chest demonstrates clear lungs and normal heart size. Pacemaking device is noted.  Two views of the abdomen show no free intraperitoneal air. There is no evidence of bowel obstruction. Mild gaseous distention of the colon is noted. Suprapubic catheter is in place.  IMPRESSION: Mild gaseous cyst the colon suggestive of ileus. The examination is otherwise negative.   Electronically Signed   By: Inge Rise M.D.   On: 03/29/2013 20:08    EKG Interpretation   None       MDM   Final diagnoses:  Diarrhea  Renal insufficiency  Dehydration    Pt presenting with c/o diarrhea for 3 days and generalized weakness.  Pt has had increased foley output after IV fluids and family feels he is closer to his baseline.  Admission was offered and family feels they can push fluids at home.  D/w urology as above and family will call to reschedule the procedure.  Hold cipro for now as it was just for prophylaxis, urology recommends no further abx treatment at this time due  to chronic uti and foley in place.  Discharged with strict return precautions.  Pt agreeable with plan.    Threasa Beards, MD 03/30/13 [id number]

## 2013-03-29 NOTE — ED Notes (Signed)
Per family. Pt has been on abx for past several days in preparation for laser surgery of prostate tomrrow. Denies emesis, pt has been able to keep down fluids. Family states he has less urine output from foley than usual, they noticed some blood in urine on the way here. Also state pt has been weaker than usual

## 2013-03-29 NOTE — ED Notes (Addendum)
Pt drinking for CT

## 2013-03-29 NOTE — ED Notes (Signed)
Pt started antibiotic (Cipro) as pre-op for laser surgery for enlarged prostate, having surgery 2/10, started cipro Friday night, has had a total of 5 doses, started having diarrhea yesterday, pt has a catheter x 2 months d/t enlarged prostate, called doctor and was told to stop antibiotic, family feels like pt is dehydrated, pt has been drinking water and gatorade today, denies pain.

## 2013-03-29 NOTE — ED Notes (Signed)
Bladder scan showed 142 ml in bladder

## 2013-03-29 NOTE — ED Notes (Signed)
Initial Contact - pt resting on stretcher with family at bs, pt denies needs/complaints at this time.  Skin PWD.  MAEI.  Speaking full/clear sentences.  IVF inf a/o.  Pt with urine draining in leg bag, per family "it's working better now".  NAD.

## 2013-03-29 NOTE — Discharge Instructions (Signed)
Return to the ED with any concerns including vomiting and not able to keep down liquids, abdominal pain, fever/chills, decreased urine output, decreased level of alertness/lethargy, or any other alarming symptoms  You should stop taking the cipro, call the urology office to arrange for appointment  Be sure to increase fluid intake.

## 2013-03-29 NOTE — ED Notes (Signed)
Pt resting on stretcher, awaiting CT scan, family at bs.  Leg bag emptied of approx 2109ml dark yellow urine.  Pt denies further needs/complaints at this time.  NAD.

## 2013-03-30 ENCOUNTER — Encounter (HOSPITAL_COMMUNITY): Payer: Self-pay | Admitting: Registered Nurse

## 2013-03-30 ENCOUNTER — Telehealth: Payer: Self-pay | Admitting: Internal Medicine

## 2013-03-30 ENCOUNTER — Ambulatory Visit: Payer: Medicare Other | Admitting: Neurology

## 2013-03-30 ENCOUNTER — Ambulatory Visit (HOSPITAL_COMMUNITY): Admission: RE | Admit: 2013-03-30 | Payer: Medicare Other | Source: Ambulatory Visit | Admitting: Urology

## 2013-03-30 LAB — URINE CULTURE
COLONY COUNT: NO GROWTH
CULTURE: NO GROWTH

## 2013-03-30 SURGERY — GREEN LIGHT LASER TURP (TRANSURETHRAL RESECTION OF PROSTATE
Anesthesia: General

## 2013-03-30 MED ORDER — FENTANYL CITRATE 0.05 MG/ML IJ SOLN
INTRAMUSCULAR | Status: AC
  Filled 2013-03-30: qty 5

## 2013-03-30 MED ORDER — LIDOCAINE HCL (CARDIAC) 20 MG/ML IV SOLN
INTRAVENOUS | Status: AC
  Filled 2013-03-30: qty 5

## 2013-03-30 MED ORDER — PROPOFOL 10 MG/ML IV BOLUS
INTRAVENOUS | Status: AC
  Filled 2013-03-30: qty 20

## 2013-03-30 MED ORDER — ONDANSETRON HCL 4 MG/2ML IJ SOLN
INTRAMUSCULAR | Status: AC
  Filled 2013-03-30: qty 2

## 2013-03-30 NOTE — Telephone Encounter (Signed)
Patient Information:  Caller Name: Stanton Kidney  Phone: [phone]  Patient: [name]  Gender: Male  DOB: 02-Mar-1925  Age: 80 Years  PCP: Alysia Penna [facility])  Office Follow Up:  Does the office need to follow up with this patient?: Yes  Instructions For The Office: Does MD agree with care advice and ok to see 04/01/13 or does he wish to see patient in office today per disposition of protocol?  RN Note:  Home care advice given.  Patient just released from er and was rehydrated there;  At this time should be ok to wait until ov on 04/01/13;  Reviewed with wife and daughter signs of dehydration and emergent symptoms;  Advised to notify MD if patient will not drink, has no uop or less than 250 cc in 8 hrs, pt not able to hold self up, stand, or is hard to arouse;  Patient may take longer to recover from the weakness due to hx of Alzheimer/Dementia, monitor him for falls;  Notify MD if watery stools do not start to slow down, patient not drinking or is getting weaker or develops fever.  If after patient wakes today symptoms are improving continue to hydrate and follow care advice and keep f/u appt.  if not should notify md right away.  Symptoms  Reason For Call & Symptoms: Started Cipro 03/26/13 for pre-op purposes per urology;  Diarrhea started on early am 03/28/13;  Seen in er for weakness due to the diarrhea 03/29/13 and discharged home early this am 2/10;  Patient couldn't stand without help or walk which was a change for him;   Procedure canceled for now, advised to f/u with pcp within 2 days and has appt. scheduled 04/01/13;  Calling nurse this am because they are unsure if he is ok to wait until Thurs. appt.;  Has had 5 episodes of watery stools so far today since 03 am;  Last dose of Cipro was 03/28/13 pm;  No vomiting, afebrile, no abdominal pain.  Decreased appetite but he is drinking;  Has Foley Catheter in place and is having output but it has been decreased;  At time of call the bag reads  600 ml's;  Patient has been asleep since coming home at 0130 am.  Reviewed Health History In EMR: Yes  Reviewed Medications In EMR: Yes  Reviewed Allergies In EMR: Yes  Reviewed Surgeries / Procedures: Yes  Date of Onset of Symptoms: 03/28/2013  Treatments Tried: ER-IV fluids, CT, Labs  Treatments Tried Worked: Yes  Guideline(s) Used:  Diarrhea  Disposition Per Guideline:   Go to Office Now  Reason For Disposition Reached:   Age > 60 years and has had > 6 diarrhea stools in past 24 hours  Advice Given:  Reassurance:  Here is some care advice that should help.  Fluids:  Drink more fluids, at least 8-10 glasses (8 oz or 240 ml) daily.  For example: sports drinks, diluted fruit juices, soft drinks.  Supplement this with saltine crackers or soups to make certain that you are getting sufficient fluid and salt to meet your body's needs.  Avoid caffeinated beverages (Reason: caffeine is mildly dehydrating).  Nutrition:  Maintaining some food intake during episodes of diarrhea is important.  Ideal initial foods include boiled starches/cereals (e.g., potatoes, rice, noodles, wheat, oats) with a small amount of salt to taste.  Other acceptable foods include: bananas, yogurt, crackers, soup.  As your stools return to normal consistency, resume a normal diet.  Diarrhea Medication  -  Imodium AD:   Helps reduce diarrhea.  Adult dosage: 4 mg (2 capsules or 4 teaspoons or 20 ml) is the recommended first dose. You may take an additional 2 mg (1 capsule or 2 teaspoons or 10 ml) after each loose BM.  Maximum dosage: 16 mg (8 capsules or 16 teaspoons or 80 ml).  1 capsule = 2 mg; 1 teaspoon (5 ml) = 1 mg.  Caution: Do not use if you have a fever greater than 100F (37.8C). Do not use if there is blood or mucus in your stools. Do not use for more than 2 days.  Read all package instructions.  Call Back If:  Signs of dehydration occur (e.g., no urine for more than 12 hours, very dry mouth,  lightheaded, etc.)  You become worse.  RN Overrode Recommendation:  Patient Already Has Appt, Document Patient  Just released from the er.  Emergent symptoms reviewed.

## 2013-04-01 ENCOUNTER — Ambulatory Visit (INDEPENDENT_AMBULATORY_CARE_PROVIDER_SITE_OTHER): Payer: Medicare Other | Admitting: Internal Medicine

## 2013-04-01 ENCOUNTER — Encounter: Payer: Self-pay | Admitting: Internal Medicine

## 2013-04-01 VITALS — BP 130/70 | HR 105 | Temp 98.3°F | Resp 20 | Ht 68.0 in | Wt 166.0 lb

## 2013-04-01 DIAGNOSIS — G309 Alzheimer's disease, unspecified: Secondary | ICD-10-CM

## 2013-04-01 DIAGNOSIS — R413 Other amnesia: Secondary | ICD-10-CM

## 2013-04-01 DIAGNOSIS — R197 Diarrhea, unspecified: Secondary | ICD-10-CM

## 2013-04-01 DIAGNOSIS — E785 Hyperlipidemia, unspecified: Secondary | ICD-10-CM

## 2013-04-01 DIAGNOSIS — F0280 Dementia in other diseases classified elsewhere without behavioral disturbance: Secondary | ICD-10-CM

## 2013-04-01 NOTE — Progress Notes (Signed)
Pre-visit discussion using our clinic review tool. No additional management support is needed unless otherwise documented below in the visit note.  

## 2013-04-01 NOTE — Patient Instructions (Signed)
Drink as much fluid as you  can tolerate over the next few days  Follow up urology

## 2013-04-01 NOTE — Progress Notes (Signed)
Subjective:    Patient ID: [name], male    DOB: Nov 22, 1925, 80 y.o.   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 He was seen in ED recently been to profuse diarrhea. This started after preoperative Cipro for treatment of a bladder outlet obstruction due to BPH. He has required a chronic indwelling Foley. Surgery canceled due to the acute illness. Stool sample for enteric pathogens and C. difficile toxin and R. pending. The patient's diarrhea has resolved and he has had no bowel movements in the last 2 days. His by mouth intake has been adequate. His family feels his general status is back to baseline except for residual weakness.  ED records reviewed  Past Medical History  Diagnosis Date  . Bladder neck obstruction     HAS FOLEY CATHETER  . Syncope and collapse     due to bradycardia/pauses  . Other and unspecified hyperlipidemia   . Gout, unspecified     GOUT FLARE UP LEFT FOOT ON 02/22/13 - RESOLVED AFTER TAKING 2 DOSES OF INDOCIN  . Bradycardia   . B12 deficiency   . CA in situ bladder   . Hyperlipidemia   . Pacemaker   . GERD (gastroesophageal reflux disease)     RARE - WOULD TAKE TUMS IF NEEDED  . Problems with hearing     WEARS RIGHT HEARING AID  . Dementia     HAS MEMORY PROBLEMS -DEPENDS ON HIS WIFE TO HELP HIM ANSWER QUESTIONS  . Cancer     PAST HX OF POLYPS IN BLADDER - REMOVED - WIFE STATES NOT SURE IF CANCEROUS    History   Social History  . Marital Status: Married    Spouse Name: N/A    Number of Children: N/A  . Years of Education: N/A   Occupational History  . RETIRED Pediatrician    Social History Main Topics  . Smoking status: Former Smoker    Quit date: 02/18/1958  . Smokeless tobacco: Never Used  . Alcohol Use: Yes     Comment: small glass of red wine  - RARELY  . Drug Use: No  . Sexual Activity: Not on file   Other Topics Concern  . Not on file   Social History  Narrative   Regular exercise - YES    Past Surgical History  Procedure Laterality Date  . Inguinal hernia repair  2.27.2006    left w/mesh Dr Pedro Earls   . Pacemaker placement  07/28/2008    ([employer]tr[employer] Adapta L)  . Pacemaker removal  08/2008    Due to MSSA   . Pacemaker placement  09/2008    re-implantation   . Hernia repair      Family History  Problem Relation Age of Onset  . Heart disease Other     MI, CHF  . Stroke Other     1st degree relative <50  . Cancer Other     colon    No Known Allergies  Current Outpatient Prescriptions on File Prior to Visit  Medication Sig Dispense Refill  . donepezil (ARICEPT) 10 MG tablet Take 10 mg by mouth daily as needed (alzheimer).      . finasteride (PROSCAR) 5 MG tablet Take 5 mg by mouth daily. AT BEDTIME      . indomethacin (INDOCIN) 50 MG capsule Take 50 mg by mouth daily as needed (gout).      . tamsulosin (  FLOMAX) 0.4 MG CAPS capsule Take 0.4 mg by mouth daily. AT BEDTIME       No current facility-administered medications on file prior to visit.    BP 130/70  Pulse 105  Temp(Src) 98.3 F (36.8 C) (Oral)  Resp 20  Ht 5\' 8"  (1.727 m)  Wt 166 lb (75.297 kg)  BMI 25.25 kg/m2  SpO2 99%      Review of Systems  Constitutional: Positive for activity change, appetite change and fatigue. Negative for fever and chills.  HENT: Negative for congestion, dental problem, ear pain, hearing loss, sore throat, tinnitus, trouble swallowing and voice change.   Eyes: Negative for pain, discharge and visual disturbance.  Respiratory: Negative for cough, chest tightness, wheezing and stridor.   Cardiovascular: Negative for chest pain, palpitations and leg swelling.  Gastrointestinal: Positive for diarrhea. Negative for nausea, vomiting, abdominal pain, constipation, blood in stool and abdominal distention.  Genitourinary: Negative for urgency, hematuria, flank pain, discharge, difficulty urinating and genital sores.    Musculoskeletal: Negative for arthralgias, back pain, gait problem, joint swelling, myalgias and neck stiffness.  Skin: Negative for rash.  Neurological: Positive for weakness. Negative for dizziness, syncope, speech difficulty, numbness and headaches.  Hematological: Negative for adenopathy. Does not bruise/bleed easily.  Psychiatric/Behavioral: Negative for behavioral problems and dysphoric mood. The patient is not nervous/anxious.        Objective:   Physical Exam  Constitutional: He is oriented to person, place, and time. He appears well-developed and well-nourished. No distress.  HENT:  Head: Normocephalic.  Right Ear: External ear normal.  Left Ear: External ear normal.  Because membranes appeared well hydrated  Eyes: Conjunctivae and EOM are normal.  Neck: Normal range of motion.  Cardiovascular: Normal rate and normal heart sounds.   Pulmonary/Chest: Effort normal and breath sounds normal.  Abdominal: Bowel sounds are normal. He exhibits no distension. There is no tenderness. There is no rebound.  Musculoskeletal: Normal range of motion. He exhibits no edema and no tenderness.  Neurological: He is alert and oriented to person, place, and time.  Psychiatric: He has a normal mood and affect. His behavior is normal.  Cognitive impairment          Assessment & Plan:   Status post acute diarrheal illness now resolved Bladder outlet obstruction secondary to BPH Senile dementia of the Alzheimer's type  Followup urology Force fluids Schedule CPX

## 2013-04-02 ENCOUNTER — Encounter (HOSPITAL_COMMUNITY): Payer: Self-pay | Admitting: *Deleted

## 2013-04-02 ENCOUNTER — Other Ambulatory Visit: Payer: Self-pay | Admitting: Urology

## 2013-04-05 ENCOUNTER — Encounter (HOSPITAL_COMMUNITY): Payer: Self-pay | Admitting: Pharmacy Technician

## 2013-04-05 NOTE — H&P (Signed)
History of Present Illness                 F/u - referred Dr. Wilson Singer. His PCP is Dr. Leanne Chang.     1-urinary retention -  -Nov 2014 inability to void Nov 2014. He tried to cath unsuccessfully and saw blood. He went to ER. Ua showed 3-6 rbc. Bladder was distended, foley was bladder.   -Dec 2014 cysto - BPH, no obvious tumor, changes from catheter noted.     2-- bladder cancer - remote history of bladder cancer (1997).   -2012 cystoscopy negative  -Dec 2014 normal cysto    3-BPH - He was started on Avodart for a known large prostate (124 g).   -2011 PSA 1.18 in 2011. He has used Rapaflo and done self cath in the past.   -June 2013 start finasteride.  -June 2014 normal DRE, stopped all meds, no LUTS, normal DRE  -Dec 2014 restart finasteride, start Rapaflo (urinary retention)  UDS - I reviewed the report and images - capacity and compliance somwhat diminished (335 ml), bladder stable without leak; no voiding phase. Pt could not void. EMG quit during filling. No voiding phase. PVR 335. He's with his kids, too. He has loose bowels and bowel incontinence. We discussed typically we would see OAB and incontinence with dementia (similar to his issues with bowel movement). Also pelvic floor was quiet during study.    He is a retired Lexicographer.      Feb 2015  Interval hx  Dr. Wille Glaser returns today for preoperative greenlight. His initial greenlight was delayed because he did not start preop antibiotics for an Escherichia coli colonization. I had to cancel his surgery earlier in the week as he developed diarrhea over the weekend, had a slight bump in his temperature, slight increase in his CBC and creatinine.    Today, he feels much better. His urine Cx from ED was normal.        Past Medical History Problems  1. History of Alzheimer disease (331.0) 2. History of Bladder neck contracture (596.0) 3. History of Gout (274.9) 4. History of carpal tunnel syndrome  (V12.49) 5. History of depression (V11.8) 6. History of kidney stones (V13.01) 7. 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 History of Overactive bladder (596.51) 9. Personal history of bladder cancer (V10.51) 10. History of Urinary Frequency  Surgical History Problems  1. History of Bladder Surgery 2. History of Holter Monitor 3. History of Inguinal Hernia Repair 4. History of Pacemaker Placement (V53.31)  Current Meds 1. Finasteride 5 MG Oral Tablet; Take 1 tablet daily;  Therapy: [id number] to (Evaluate:27Nov2015)  Requested for: [phone]; Last  Rx:02Dec2014 Ordered 2. Indomethacin 50 MG Oral Capsule;  Therapy: [id number] to Recorded 3. Tamsulosin HCl - 0.4 MG Oral Capsule; TAKE 1 CAPSULE Daily;  Therapy: [id number] to (Evaluate:10Jan2015); Last Rx:11Dec2014 Ordered  Allergies Medication  1. No Known Drug Allergies  Family History Problems  1. Family history of Acute Myocardial Infarction (V17.3) : Mother 2. Family history of Carotid Artery Stenosis : Father 3. Family history of Family Health Status Number Of Children   1 Daughter & 3 Sons 4. Family history of Gout (V18.19) : Father 5. Family history of Heart Disease : Mother  Social History Problems  1. Alcohol Use   rare; red wine 2. Caffeine Use   coffee; 2-5; occasional cola or tea 3. Former smoker (V15.82) 4. Marital History - Currently Married 5. Occupation:   pediatrician 6. Tobacco Use (V15.82)   Quit 44  years ago; age 89 til 60; one or less per day.  Review of Systems Constitutional, skin, eye, otolaryngeal, hematologic/lymphatic, cardiovascular, pulmonary, endocrine, musculoskeletal, gastrointestinal, neurological and psychiatric system(s) were reviewed and pertinent findings if present are noted.    Vitals Vital Signs [Data Includes: Last 1 Day]  Recorded: [id number] 01:31PM  Blood Pressure: 131 / 78 Temperature: 97.5 F Heart Rate: 73  Physical Exam Constitutional: Well nourished and  well developed . No acute distress.  Pulmonary: No respiratory distress and normal respiratory rhythm and effort.  Cardiovascular: Heart rate and rhythm are normal . No peripheral edema.    Results/Data  Old records or history reviewed: epic notes, labs.    Assessment Assessed  1. Benign prostatic hyperplasia with urinary obstruction (600.01,599.69) 2. Urinary retention (788.20)  Plan Urinary retention  1. Start: Cephalexin 500 MG Oral Capsule; TAKE 1 CAPSULE TWICE DAILY 2. Follow-up Keep Future Appt Office  Follow-up  Status: Hold For - Appointment  Requested  for: [phone]  Discussion/Summary Discussed again with patient, wife and son nature, r/b/a to GL PVP. Discussed recent Alva. Discussed options to leave foley, CIC and chance of success. WIll start cephalexin Monday.     Signatures Electronically signed by : Festus Aloe, M.D.; Apr 02 2013  1:51PM EST

## 2013-04-06 ENCOUNTER — Ambulatory Visit (HOSPITAL_COMMUNITY)
Admission: RE | Admit: 2013-04-06 | Discharge: 2013-04-06 | Disposition: A | Discharge status: Home / Self Care | Payer: Medicare Other | Source: Ambulatory Visit | Attending: Urology | Admitting: Urology

## 2013-04-06 ENCOUNTER — Encounter (HOSPITAL_COMMUNITY)
Admission: RE | Disposition: A | Discharge status: Home / Self Care | Payer: Self-pay | Source: Ambulatory Visit | Attending: Urology

## 2013-04-06 ENCOUNTER — Encounter (HOSPITAL_COMMUNITY): Payer: Self-pay | Admitting: *Deleted

## 2013-04-06 ENCOUNTER — Ambulatory Visit (HOSPITAL_COMMUNITY): Payer: Medicare Other | Admitting: Anesthesiology

## 2013-04-06 ENCOUNTER — Encounter (HOSPITAL_COMMUNITY): Payer: Medicare Other | Admitting: Anesthesiology

## 2013-04-06 DIAGNOSIS — F3289 Other specified depressive episodes: Secondary | ICD-10-CM | POA: Insufficient documentation

## 2013-04-06 DIAGNOSIS — R339 Retention of urine, unspecified: Secondary | ICD-10-CM | POA: Insufficient documentation

## 2013-04-06 DIAGNOSIS — N400 Enlarged prostate without lower urinary tract symptoms: Secondary | ICD-10-CM | POA: Insufficient documentation

## 2013-04-06 DIAGNOSIS — F329 Major depressive disorder, single episode, unspecified: Secondary | ICD-10-CM | POA: Insufficient documentation

## 2013-04-06 DIAGNOSIS — N318 Other neuromuscular dysfunction of bladder: Secondary | ICD-10-CM | POA: Insufficient documentation

## 2013-04-06 DIAGNOSIS — D494 Neoplasm of unspecified behavior of bladder: Secondary | ICD-10-CM | POA: Insufficient documentation

## 2013-04-06 DIAGNOSIS — M109 Gout, unspecified: Secondary | ICD-10-CM | POA: Insufficient documentation

## 2013-04-06 DIAGNOSIS — N139 Obstructive and reflux uropathy, unspecified: Secondary | ICD-10-CM | POA: Insufficient documentation

## 2013-04-06 DIAGNOSIS — G309 Alzheimer's disease, unspecified: Secondary | ICD-10-CM | POA: Insufficient documentation

## 2013-04-06 DIAGNOSIS — Z8551 Personal history of malignant neoplasm of bladder: Secondary | ICD-10-CM | POA: Insufficient documentation

## 2013-04-06 DIAGNOSIS — F0280 Dementia in other diseases classified elsewhere without behavioral disturbance: Secondary | ICD-10-CM | POA: Insufficient documentation

## 2013-04-06 DIAGNOSIS — Z87891 Personal history of nicotine dependence: Secondary | ICD-10-CM | POA: Insufficient documentation

## 2013-04-06 DIAGNOSIS — Z79899 Other long term (current) drug therapy: Secondary | ICD-10-CM | POA: Insufficient documentation

## 2013-04-06 DIAGNOSIS — Z950 Presence of cardiac pacemaker: Secondary | ICD-10-CM | POA: Insufficient documentation

## 2013-04-06 HISTORY — PX: GREEN LIGHT LASER TURP (TRANSURETHRAL RESECTION OF PROSTATE: SHX6260

## 2013-04-06 LAB — BASIC METABOLIC PANEL
BUN: 13 mg/dL (ref 6–23)
CO2: 26 mEq/L (ref 19–32)
Calcium: 8.8 mg/dL (ref 8.4–10.5)
Chloride: 101 mEq/L (ref 96–112)
Creatinine, Ser: 1.15 mg/dL (ref 0.50–1.35)
GFR calc Af Amer: 65 mL/min — ABNORMAL LOW (ref >90)
GFR, EST NON AFRICAN AMERICAN: 56 mL/min — AB (ref >90)
GLUCOSE: 111 mg/dL — AB (ref 70–99)
POTASSIUM: 3.6 meq/L — AB (ref 3.7–5.3)
SODIUM: 139 meq/L (ref 137–147)

## 2013-04-06 SURGERY — GREEN LIGHT LASER TURP (TRANSURETHRAL RESECTION OF PROSTATE
Anesthesia: General | Site: Prostate

## 2013-04-06 MED ORDER — NITROFURANTOIN MONOHYD MACRO 100 MG PO CAPS: 100.0000 mg | ORAL_CAPSULE | Freq: Every day | ORAL | Status: DC

## 2013-04-06 MED ORDER — FENTANYL CITRATE 0.05 MG/ML IJ SOLN
INTRAMUSCULAR | Status: AC
  Filled 2013-04-06: qty 2

## 2013-04-06 MED ORDER — LACTATED RINGERS IV SOLN: INTRAVENOUS | Status: DC

## 2013-04-06 MED ORDER — LIDOCAINE HCL (CARDIAC) 20 MG/ML IV SOLN
INTRAVENOUS | Status: DC | PRN
  Administered 2013-04-06: 40 mg via INTRAVENOUS

## 2013-04-06 MED ORDER — ONDANSETRON HCL 4 MG/2ML IJ SOLN
INTRAMUSCULAR | Status: AC
  Filled 2013-04-06: qty 2

## 2013-04-06 MED ORDER — PROPOFOL 10 MG/ML IV BOLUS
INTRAVENOUS | Status: DC | PRN
  Administered 2013-04-06: 100 mg via INTRAVENOUS

## 2013-04-06 MED ORDER — CEFAZOLIN SODIUM-DEXTROSE 2-3 GM-% IV SOLR
INTRAVENOUS | Status: AC
  Filled 2013-04-06: qty 50

## 2013-04-06 MED ORDER — ONDANSETRON HCL 4 MG/2ML IJ SOLN
INTRAMUSCULAR | Status: DC | PRN
  Administered 2013-04-06: 4 mg via INTRAVENOUS

## 2013-04-06 MED ORDER — FENTANYL CITRATE 0.05 MG/ML IJ SOLN
25.0000 ug | INTRAMUSCULAR | Status: DC | PRN
  Administered 2013-04-06: 50 ug via INTRAVENOUS
  Administered 2013-04-06 (×2): 25 ug via INTRAVENOUS

## 2013-04-06 MED ORDER — LACTATED RINGERS IV SOLN
INTRAVENOUS | Status: DC | PRN
  Administered 2013-04-06 (×2): via INTRAVENOUS

## 2013-04-06 MED ORDER — PROPOFOL 10 MG/ML IV BOLUS
INTRAVENOUS | Status: AC
  Filled 2013-04-06: qty 20

## 2013-04-06 MED ORDER — EPHEDRINE SULFATE 50 MG/ML IJ SOLN
INTRAMUSCULAR | Status: DC | PRN
  Administered 2013-04-06: 5 mg via INTRAVENOUS

## 2013-04-06 MED ORDER — BELLADONNA ALKALOIDS-OPIUM 16.2-60 MG RE SUPP
RECTAL | Status: AC
  Filled 2013-04-06: qty 1

## 2013-04-06 MED ORDER — SODIUM CHLORIDE 0.9 % IR SOLN
Status: DC | PRN
  Administered 2013-04-06: 17000 mL via INTRAVESICAL

## 2013-04-06 MED ORDER — LIDOCAINE HCL (CARDIAC) 20 MG/ML IV SOLN
INTRAVENOUS | Status: AC
  Filled 2013-04-06: qty 5

## 2013-04-06 MED ORDER — FENTANYL CITRATE 0.05 MG/ML IJ SOLN
INTRAMUSCULAR | Status: DC | PRN
  Administered 2013-04-06 (×6): 25 ug via INTRAVENOUS

## 2013-04-06 MED ORDER — BELLADONNA ALKALOIDS-OPIUM 16.2-60 MG RE SUPP
RECTAL | Status: DC | PRN
  Administered 2013-04-06: 1 via RECTAL

## 2013-04-06 MED ORDER — CEFAZOLIN SODIUM-DEXTROSE 2-3 GM-% IV SOLR
2.0000 g | INTRAVENOUS | Status: AC
  Administered 2013-04-06: 2 g via INTRAVENOUS

## 2013-04-06 SURGICAL SUPPLY — 21 items
BAG URINE DRAINAGE (UROLOGICAL SUPPLIES) ×3 IMPLANT
BAG URO CATCHER STRL LF (DRAPE) ×3 IMPLANT
CATH FOLEY 2WAY 5CC 20FR (CATHETERS) ×2 IMPLANT
CATH TIEMANN FOLEY 18FR 5CC (CATHETERS) IMPLANT
DRAPE CAMERA CLOSED 9X96 (DRAPES) ×3 IMPLANT
ELECT BUTTON HF 24-28F 2 30DE (ELECTRODE) IMPLANT
ELECT LOOP MED HF 24F 12D (CUTTING LOOP) IMPLANT
ELECT LOOP MED HF 24F 12D CBL (CLIP) IMPLANT
ELECT RESECT VAPORIZE 12D CBL (ELECTRODE) IMPLANT
FEE RENTAL LASER GREENLIGHT (Laser) ×1 IMPLANT
GLOVE BIOGEL M STRL SZ7.5 (GLOVE) ×3 IMPLANT
GOWN STRL REUS W/TWL XL LVL3 (GOWN DISPOSABLE) ×3 IMPLANT
HOLDER FOLEY CATH W/STRAP (MISCELLANEOUS) IMPLANT
LASER FIBER /GREENLIGHT LASER (Laser) ×3 IMPLANT
LASER GREENLIGHT RENTAL P/PROC (Laser) ×3 IMPLANT
MANIFOLD NEPTUNE II (INSTRUMENTS) ×3 IMPLANT
PACK CYSTO (CUSTOM PROCEDURE TRAY) ×3 IMPLANT
SYR 30ML LL (SYRINGE) IMPLANT
SYRINGE IRR TOOMEY STRL 70CC (SYRINGE) IMPLANT
TUBING CONNECTING 10 (TUBING) ×2 IMPLANT
TUBING CONNECTING 10' (TUBING) ×1

## 2013-04-06 NOTE — Discharge Instructions (Signed)
Foley Catheter Care, Adult A Foley catheter is a soft, flexible tube that is placed into the bladder to drain urine. A Foley catheter may be inserted if:  You leak urine or are not able to control when you urinate (urinary incontinence).  You are not able to urinate when you need to (urinary retention).  You had prostate surgery or surgery on the genitals.  You have certain medical conditions, such as multiple sclerosis, dementia, or a spinal cord injury. If you are going home with a Foley catheter in place, follow the instructions below. TAKING CARE OF THE CATHETER 1. Wash your hands with soap and water. 2. Using mild soap and warm water on a clean washcloth:  Clean the area on your body closest to the catheter insertion site using a circular motion, moving away from the catheter. Never wipe toward the catheter because this could sweep bacteria up into the urethra and cause infection.  Remove all traces of soap. Pat the area dry with a clean towel. For males, reposition the foreskin. 3. Attach the catheter to your leg so there is no tension on the catheter. Use adhesive tape or a leg strap. If you are using adhesive tape, remove any sticky residue left behind by the previous tape you used. 4. Keep the drainage bag below the level of the bladder, but keep it off the floor. 5. Check throughout the day to be sure the catheter is working and urine is draining freely. Make sure the tubing does not become kinked. 6. Do not pull on the catheter or try to remove it. Pulling could damage internal tissues. TAKING CARE OF THE DRAINAGE BAGS You will be given two drainage bags to take home. One is a large overnight drainage bag, and the other is a smaller leg bag that fits underneath clothing. You may wear the overnight bag at any time, but you should never wear the smaller leg bag at night. Follow the instructions below for how to empty, change, and clean your drainage bags. Emptying the Drainage  Bag You must empty your drainage bag when it is    full or at least 2 3 times a day. 1. Wash your hands with soap and water. 2. Keep the drainage bag below your hips, below the level of your bladder. This stops urine from going back into the tubing and into your bladder. 3. Hold the dirty bag over the toilet or a clean container. 4. Open the pour spout at the bottom of the bag and empty the urine into the toilet or container. Do not let the pour spout touch the toilet, container, or any other surface. Doing so can place bacteria on the bag, which can cause an infection. 5. Clean the pour spout with a gauze pad or cotton ball that has rubbing alcohol on it. 6. Close the pour spout. 7. Attach the bag to your leg with adhesive tape or a leg strap. 8. Wash your hands well. Changing the Drainage Bag Change your drainage bag once a month or sooner if it starts to smell bad or look dirty. Below are steps to follow when changing the drainage bag. 1. Wash your hands with soap and water. 2. Pinch off the rubber catheter so that urine does not spill out. 3. Disconnect the catheter tube from the drainage tube at the connection valve. Do not let the tubes touch any surface. 4. Clean the end of the catheter tube with an alcohol wipe. Use a different alcohol wipe  to clean the end of the drainage tube. 5. Connect the catheter tube to the drainage tube of the clean drainage bag. 6. Attach the new bag to the leg with adhesive tape or a leg strap. Avoid attaching the new bag too tightly. 7. Wash your hands well. Cleaning the Drainage Bag 1. Wash your hands with soap and water. 2. Wash the bag in warm, soapy water. 3. Rinse the bag thoroughly with warm water. 4. Fill the bag with a solution of white vinegar and water (1 cup vinegar to 1 qt warm water [.2 L vinegar to 1 L warm water]). Close the bag and soak it for 30 minutes in the solution. 5. Rinse the bag with warm water. 6. Hang the bag to dry with the  pour spout open and hanging downward. 7. Store the clean bag (once it is dry) in a clean plastic bag. 8. Wash your hands well. PREVENTING INFECTION  Wash your hands before and after handling your catheter.  Take showers daily and wash the area where the catheter enters your body. Do not take baths. Replace wet leg straps with dry ones, if this applies.  Do not use powders, sprays, or lotions on the genital area. Only use creams, lotions, or ointments as directed by your caregiver.  For females, wipe from front to back after each bowel movement.  Drink enough fluids to keep your urine clear or pale yellow unless you have a fluid restriction.  Do not let the drainage bag or tubing touch or lie on the floor.  Wear cotton underwear to absorb moisture and to keep your skin drier. SEEK MEDICAL CARE IF:   Your urine is cloudy or smells unusually bad.  Your catheter becomes clogged.  You are not draining urine into the bag or your bladder feels full.  Your catheter starts to leak. SEEK IMMEDIATE MEDICAL CARE IF:   You have pain, swelling, redness, or pus where the catheter enters the body.  You have pain in the abdomen, legs, lower back, or bladder.  You have a fever.  You see blood fill the catheter, or your urine is pink or red.  You have nausea, vomiting, or chills.  Your catheter gets pulled out. MAKE SURE YOU:   Understand these instructions.  Will watch your condition.  Will get help right away if you are not doing well or get worse. Document Released: 02/04/2005 Document Revised: 06/01/2012 Document Reviewed: 01/27/2012 [facility] Patient Information 2014 [place]. Prostate Laser Surgery, Care After Refer to this sheet in the next few weeks. These instructions provide you with information on caring for yourself after your procedure. Your health care provider may also give you more specific instructions. Your treatment has been planned according to current medical  practices, but problems sometimes occur. Call your health care provider if you have any problems or questions after your procedure. WHAT TO EXPECT AFTER THE PROCEDURE  Blood in your urine will last from a few days to 3 weeks. You may have bleeding in the urine.  After your catheter is removed, you will have burning (especially at the tip of your penis) when you urinate, especially at the end of urination. For the first few weeks after your procedure, you will feel the need to urinate often. HOME CARE INSTRUCTIONS   Do not perform vigorous exercise, especially heavy lifting, for 1 week or as directed by your health care provider.  Avoid sexual activity for 4 6 weeks or as directed by your health  care provider.  Do not ride in a car for extended periods for 1 month or as directed by your health care provider.  Do not strain to have a bowel movement. Drink a lot of fluids and and make sure you get enough fiber in your diet.  Drink enough fluids to keep your urine clear or pale yellow. SEEK IMMEDIATE MEDICAL CARE IF:   Your catheter has been removed and you are suddenly unable to urinate.  Your catheter has not been removed, and it develops a blockage.  You start to have blood clots in your urine.  The blood in your urine becomes persistent or gets thick.  Your temperature is greater than 100.99F (38.1C).  You develop chest pains.  You develop shortness of breath.  You develop leg swelling or pain. Document Released: 02/04/2005 Document Revised: 10/07/2012 Document Reviewed: 07/27/2012 [facility] Patient Information 2014 [place].

## 2013-04-06 NOTE — Op Note (Signed)
Preoperative diagnosis: Urinary retention, BPH,  bladder neoplasm Postoperative diagnosis: Same  Procedure: Exam under anesthesia Greenlight photo vaporization of the prostate Bladder biopsy fulguration  Surgeon: Hildegarde Dunaway  Type of anesthesia: Gen.  Findings: On exam under anesthesia the penis was normal without mass or lesion. Testicles were descended bilaterally and were palpably normal. On digital rectal exam the prostate was enlarged but smooth without hard area or nodule. The lateral sulci were preserved normally. There were no bladder masses. A B&O suppository was placed.  On cystoscopy the urethra appeared normal. The prostatic urethra was elongated with trilobar hypertrophy and a high median bar. The trigone and ureteral orifices were normal pre-and post lasering. They had good clear efflux pre-and post lasering. There was moderate trabeculation. There was no stone or foreign body in the bladder. On the right posterior bladder wall there was bullous edema which seem to be in a peculiar position to attribute solely to catheter trauma given it was off of the midline. Given the patient's history bladder cancer this area was biopsied x2 and fulgurated.  Description of procedure: After consent was obtained patient brought to the operating room. After adequate anesthesia he is placed in lithotomy position. A timeout was performed to confirm the patient and procedure. An exam under anesthesia was performed and I placed a B&O suppository. He was prepped and draped in the usual sterile fashion. The laser scope was passed per urethra and the bladder examined. I then vaporized the lateral lobe starting from the bladder neck going anterior to posterior bladder veru. I started on the right minimally to the left side. The left lobe was larger than the right. I then made incisions at 5:00 and 7:00 to gauge the depth of vaporization and routinely inspected the trigone and ureteral orifices during this  process. I then turned our up to 120 in and 140 and again laser the lateral lobes down. And then turned toward the median lobe and vaporizing from lateral to medial and lateral to superior to down the median lobe from the bladder neck down to the veru. This created a wide-open channel. There was excellent hemostasis at low-pressure.  I routinely checked the bladder neck, trigone, ureteral orifices and the veru throughout lasering. I did not laser/vaporized past the veru.   Next I switched out the laser scope with the cystoscope and using the cold cup biopsy forceps biopsied the abnormal mucosa in the right posterior x2. There is and fulgurated. Again there was excellent hemostasis under low pressure. The bladder was refilled and the scope removed. A 20 French Foley catheter was placed and seated at the bladder neck. The urine was clear.  The patient was awakened and taken to the recovery room in stable condition.  Complications: None Blood loss: Minimal  Specimens: Bladder biopsy x2  Drains: 20 French Foley

## 2013-04-06 NOTE — Anesthesia Preprocedure Evaluation (Signed)
Anesthesia Evaluation  Patient identified by MRN, date of birth, ID band Patient awake    Reviewed: Allergy & Precautions, H&P , NPO status , Patient's Chart, lab work & pertinent test results  Airway Mallampati: II TM Distance: >3 FB Neck ROM: full    Dental no notable dental hx. (+) Teeth Intact, Dental Advisory Given   Pulmonary neg pulmonary ROS, former smoker,  breath sounds clear to auscultation  Pulmonary exam normal       Cardiovascular + pacemaker Rhythm:regular Rate:Normal     Neuro/Psych dementia negative neurological ROS  negative psych ROS   GI/Hepatic negative GI ROS, Neg liver ROS,   Endo/Other  negative endocrine ROS  Renal/GU negative Renal ROS  negative genitourinary   Musculoskeletal   Abdominal   Peds  Hematology negative hematology ROS (+)   Anesthesia Other Findings   Reproductive/Obstetrics negative OB ROS                           Anesthesia Physical Anesthesia Plan  ASA: III  Anesthesia Plan: General   Post-op Pain Management:    Induction: Intravenous  Airway Management Planned: LMA  Additional Equipment:   Intra-op Plan:   Post-operative Plan:   Informed Consent: I have reviewed the patients History and Physical, chart, labs and discussed the procedure including the risks, benefits and alternatives for the proposed anesthesia with the patient or authorized representative who has indicated his/her understanding and acceptance.   Dental Advisory Given  Plan Discussed with: CRNA and Surgeon  Anesthesia Plan Comments:         Anesthesia Quick Evaluation

## 2013-04-06 NOTE — Interval H&P Note (Signed)
History and Physical Interval Note:  04/06/2013 11:00 AM  [name]  has presented today for surgery, with the diagnosis of Urinary Retention, Benign Prostatic Hyperplasia  The various methods of treatment have been discussed with the patient and family. After consideration of risks, benefits and other options for treatment, the patient has consented to  Procedure(s) with comments: GREEN LIGHT LASER TURP (TRANSURETHRAL RESECTION OF PROSTATE (N/A) - BLADDER BIOPSY      as a surgical intervention .  The patient's history has been reviewed, patient examined, no change in status, stable for surgery.  I have reviewed the patient's chart and labs.  Questions were answered to the patient's satisfaction.  BMET returned to baseline. I discussed with the patient and his wife again the nature, potential benefits, risks and alternatives to Greenlight PVP, including side effects of the proposed treatment, the likelihood of the patient achieving the goals of the procedure, and any potential problems that might occur during the procedure or recuperation. All questions answered. Patient elects to proceed.     [name]

## 2013-04-06 NOTE — Transfer of Care (Signed)
Immediate Anesthesia Transfer of Care Note  Patient: [name]  Procedure(s) Performed: Procedure(s) with comments: GREEN LIGHT LASER TURP (TRANSURETHRAL RESECTION OF PROSTATE (N/A) - BLADDER BIOPSY       Patient Location: PACU  Anesthesia Type:General  Level of Consciousness: awake, alert  and oriented  Airway & Oxygen Therapy: Patient Spontanous Breathing and Patient connected to face mask oxygen  Post-op Assessment: Report given to PACU RN and Post -op Vital signs reviewed and stable  Post vital signs: Reviewed and stable  Complications: No apparent anesthesia complications

## 2013-04-06 NOTE — Preoperative (Signed)
Beta Blockers   Reason not to administer Beta Blockers:Not Applicable 

## 2013-04-06 NOTE — Anesthesia Postprocedure Evaluation (Signed)
  Anesthesia Post-op Note  Patient: [name]  Procedure(s) Performed: Procedure(s) (LRB): GREEN LIGHT LASER TURP (TRANSURETHRAL RESECTION OF PROSTATE (N/A)  Patient Location: PACU  Anesthesia Type: General  Level of Consciousness: awake and alert   Airway and Oxygen Therapy: Patient Spontanous Breathing  Post-op Pain: mild  Post-op Assessment: Post-op Vital signs reviewed, Patient's Cardiovascular Status Stable, Respiratory Function Stable, Patent Airway and No signs of Nausea or vomiting  Last Vitals:  Filed Vitals:   04/06/13 1427  BP:   Pulse: 70  Temp: 36.7 C  Resp: 16    Post-op Vital Signs: stable   Complications: No apparent anesthesia complications

## 2013-04-20 ENCOUNTER — Other Ambulatory Visit: Payer: Self-pay | Admitting: Neurology

## 2013-04-20 ENCOUNTER — Ambulatory Visit (INDEPENDENT_AMBULATORY_CARE_PROVIDER_SITE_OTHER): Payer: Medicare Other | Admitting: Neurology

## 2013-04-20 ENCOUNTER — Encounter: Payer: Self-pay | Admitting: Neurology

## 2013-04-20 VITALS — BP 124/70 | HR 68 | Temp 97.5°F | Resp 18 | Ht 68.0 in | Wt 146.3 lb

## 2013-04-20 DIAGNOSIS — G309 Alzheimer's disease, unspecified: Principal | ICD-10-CM

## 2013-04-20 DIAGNOSIS — F0280 Dementia in other diseases classified elsewhere without behavioral disturbance: Secondary | ICD-10-CM

## 2013-04-20 MED ORDER — MEMANTINE HCL 5 MG PO TABS: ORAL_TABLET | ORAL | Status: DC

## 2013-04-20 NOTE — Progress Notes (Signed)
NEUROLOGY CONSULTATION NOTE  [name] MRN: [id number] DOB: Apr 15, 1925  HISTORY OF PRESENT ILLNESS: [name] is an 80 year old right-handed man with history of CA in situ bladder, bradycardia, gout, hyperlipidemia and B12 deficiency who presents for Alzheimer's disease.  He was previously followed by Dr. Drusilla Kanner, who has since left [facility].  Records and images were personally reviewed where available.  He is accompanied by his wife and daughter.    Mild symptoms were first noticed for several years but became most noticeable in 2010.  At first, he would become disoriented while driving on familiar routes.  He exhibited short-term memory problems such as misplacing objects.  He would go to the grocery store for items and forget what to buy.  Over the past year, this has significantly progressed.  He lives with his wife, who tends to his ADLs.  She has to tell him to take showers and needs to stand by the shower to assist him if needed.  He is unable to dress himself.  She feeds him two meals a day.  His appetite is good.  He has excessive somnolence.  He wakes up around lunch time, which is why he only eats two meals a day.  He is not depressed.  He does not hallucinate.  He does check to see if the doors are locked at night a couple of times before retiring to bed, but he is not paranoid.  He does not drive.  There has been no significant change in personality other than being less energetic.  He is not more irritable.  He is not abusive or combative.  He is still very sweet.  One time, he asked his daughter to remind him his wife's name.  Another time, he put a glove in the oven and left it on.  He typically sits around and sleeps all day.  Neuropsychological testing performed on 03/18/11 revealed marked impairment in memory and certain other cognitive domains, suggestive of probable Alzheimer's dementia.  Neurocognitive testing from 09/17/11 revealed impaired immediate memory,  story memory, semantic fluency, list recall, list recognition, story recall, figure recall, and clock drawing test, was consistent with dementia.  Re-evaluation from 09/15/12 revealed further decline most notably in contextual verbal memory, visuospatial functioning and aspects of executive functioning.  MMSE score that day was 19/30.  He has history of B12 deficiency and last level performed on 01/30/11 was 268.  He is no longer on supplementation.  Last TSH performed same time was 4.66.  He was taking Aricept up until several months ago, because they were unable to get a refill.    He and his wife live alone.  They have two sons that live in [place], but unable to provide regular help for their mother.  Their daughter lives an hour and a half away.  His wife feels she needs some help to care for him, because he requires so much of her time.  She is afraid to leave him home alone while she goes out to run errands.  PAST MEDICAL HISTORY: Past Medical History  Diagnosis Date  . Bladder neck obstruction     HAS FOLEY CATHETER  . Syncope and collapse     due to bradycardia/pauses  . Other and unspecified hyperlipidemia   . Gout, unspecified     GOUT FLARE UP LEFT FOOT ON 02/22/13 - RESOLVED AFTER TAKING 2 DOSES OF INDOCIN  . Bradycardia   . B12 deficiency   . CA in  situ bladder   . Hyperlipidemia   . Pacemaker   . GERD (gastroesophageal reflux disease)     RARE - WOULD TAKE TUMS IF NEEDED  . Problems with hearing     WEARS RIGHT HEARING AID  . Dementia     HAS MEMORY PROBLEMS -DEPENDS ON HIS WIFE TO HELP HIM ANSWER QUESTIONS  . Cancer     PAST HX OF POLYPS IN BLADDER - REMOVED - WIFE STATES NOT SURE IF CANCEROUS    PAST SURGICAL HISTORY: Past Surgical History  Procedure Laterality Date  . Inguinal hernia repair  2.27.2006    left w/mesh Dr Pedro Earls   . Pacemaker placement  07/28/2008    (Medtronic Adapta L)  . Pacemaker removal  08/2008    Due to MSSA   . Pacemaker placement   09/2008    re-implantation   . Hernia repair    . Green light laser turp (transurethral resection of prostate N/A 04/06/2013    Procedure: GREEN LIGHT LASER TURP (TRANSURETHRAL RESECTION OF PROSTATE;  Surgeon: [name], MD;  Location: [facility];  Service: Urology;  Laterality: N/A;  BLADDER BIOPSY         MEDICATIONS: Current Outpatient Prescriptions on File Prior to Visit  Medication Sig Dispense Refill  . donepezil (ARICEPT) 10 MG tablet Take 10 mg by mouth daily as needed (alzheimer).      . finasteride (PROSCAR) 5 MG tablet Take 5 mg by mouth daily. AT BEDTIME      . indomethacin (INDOCIN) 50 MG capsule Take 50 mg by mouth daily as needed (gout).      . nitrofurantoin, macrocrystal-monohydrate, (MACROBID) 100 MG capsule Take 1 capsule (100 mg total) by mouth at bedtime.  10 capsule  0  . tamsulosin (FLOMAX) 0.4 MG CAPS capsule Take 0.4 mg by mouth daily. AT BEDTIME       No current facility-administered medications on file prior to visit.    ALLERGIES: No Known Allergies  FAMILY HISTORY: Family History  Problem Relation Age of Onset  . Heart disease Other     MI, CHF  . Stroke Other     1st degree relative <50  . Cancer Other     colon    SOCIAL HISTORY: History   Social History  . Marital Status: Married    Spouse Name: N/A    Number of Children: N/A  . Years of Education: N/A   Occupational History  . RETIRED Pediatrician    Social History Main Topics  . Smoking status: Former Smoker    Quit date: 02/18/1958  . Smokeless tobacco: Never Used  . Alcohol Use: Yes     Comment: small glass of red wine  - RARELY  . Drug Use: No  . Sexual Activity: Not on file   Other Topics Concern  . Not on file   Social History Narrative   Regular exercise - YES    REVIEW OF SYSTEMS: Constitutional: No fevers, chills, or sweats, no generalized fatigue, change in appetite Eyes: No visual changes, double vision, eye pain Ear, nose and throat: No hearing  loss, ear pain, nasal congestion, sore throat Cardiovascular: No chest pain, palpitations Respiratory:  No shortness of breath at rest or with exertion, wheezes GastrointestinaI: No nausea, vomiting, diarrhea, abdominal pain, fecal incontinence Genitourinary:  No dysuria, urinary retention or frequency Musculoskeletal:  No neck pain, back pain Integumentary: No rash, pruritus, skin lesions Neurological: as above Psychiatric: No depression, insomnia, anxiety Endocrine: No palpitations, fatigue,  diaphoresis, mood swings, change in appetite, change in weight, increased thirst Hematologic/Lymphatic:  No anemia, purpura, petechiae. Allergic/Immunologic: no itchy/runny eyes, nasal congestion, recent allergic reactions, rashes  PHYSICAL EXAM: Filed Vitals:   04/20/13 1353  BP: 124/70  Pulse: 68  Temp: 97.5 F (36.4 C)  Resp: 18   General: No acute distress Head:  Normocephalic/atraumatic Neck: supple, no paraspinal tenderness, full range of motion Back: No paraspinal tenderness Heart: regular rate and rhythm Lungs: Clear to auscultation bilaterally. Vascular: No carotid bruits. Neurological Exam: Mental status: alert and oriented to person, place (except floor), but not time (except season), speech fluent and not dysarthric, able to name, repeat, write, read and follow 3 step commands across midline.  Able to spell WORLD backwards.  Recalled 1 of 3 words after a couple of minutes.  Did not copy intersecting pentagons correctly.  MMSE 22/30 Cranial nerves: CN I: not tested CN II: pupils equal, round and reactive to light, visual fields intact, fundi unremarkable. CN III, IV, VI:  full range of motion, no nystagmus, no ptosis CN V: facial sensation intact CN VII: upper and lower face symmetric CN VIII: hearing intact CN IX, X: gag intact, uvula midline CN XI: sternocleidomastoid and trapezius muscles intact CN XII: tongue midline Bulk & Tone: normal, no fasciculations. Motor: 5/5  throughout Sensation: pinprick and vibration intact Deep Tendon Reflexes: 2+ throughout except absent in the ankles, toes down Finger to nose testing: no tremor or dysmetria Gait: normal stance and stride. Romberg with mild sway.  IMPRESSION: Alzheimer's dementia  PLAN: 1.  Will start Namenda, titrating to goal of 10mg  BID.  Side effects discussed. 2.  Will fill out LTC Claims forms to assist in getting help.  I feel he and his wife need at least home help. 3.  Stay active, social and stimulated. 4.  Check B12 5.  Follow up in 6 months.  Thank you for allowing me to take part in the care of this patient.  Metta Clines, DO  CC:  Bluford Kaufmann, MD

## 2013-04-20 NOTE — Patient Instructions (Signed)
1.  Start Namenda (memantine) 5mg  tablets.  Take 1 tablet at bedtime for 7 days, then 1 tablet twice daily for 7 days, then 1 tablet in morning and 2 tablets at bedtime for 7 days, then 2 tablets twice daily.   Side effects include dizziness, headache, diarrhea or constipation.  Call with any questions or concerns.  Call for refill, if tolerating.  2.  Will check B12 level. 3.  Try to keep stimulated and social. 4.  Will fill out forms. 5.  Follow up in 6 months.

## 2013-04-21 LAB — VITAMIN B12: Vitamin B-12: 463 pg/mL (ref 211–911)

## 2013-04-22 LAB — METHYLMALONIC ACID, SERUM: METHYLMALONIC ACID, QUANT: 0.23 umol/L (ref <0.40)

## 2013-04-23 NOTE — Telephone Encounter (Signed)
Message copied by Claudie Revering on Fri Apr 23, 2013  9:31 AM ------      Message from: JAFFE, ADAM R      Created: Fri Apr 23, 2013  5:40 AM       B12 level is good      ----- Message -----         From: [facility]         Sent: 04/22/2013   9:44 AM           To: Dudley Major, DO                   ------

## 2013-04-26 ENCOUNTER — Telehealth: Payer: Self-pay | Admitting: *Deleted

## 2013-04-26 NOTE — Telephone Encounter (Signed)
Patient spouse picked up form today 04/26/13

## 2013-06-29 ENCOUNTER — Ambulatory Visit: Payer: Medicare Other | Admitting: Internal Medicine

## 2013-07-05 ENCOUNTER — Ambulatory Visit (INDEPENDENT_AMBULATORY_CARE_PROVIDER_SITE_OTHER): Payer: Medicare Other | Admitting: Internal Medicine

## 2013-07-05 ENCOUNTER — Encounter: Payer: Self-pay | Admitting: Internal Medicine

## 2013-07-05 VITALS — BP 112/70 | HR 76 | Temp 98.6°F | Resp 18 | Ht 68.0 in | Wt 166.0 lb

## 2013-07-05 DIAGNOSIS — G309 Alzheimer's disease, unspecified: Secondary | ICD-10-CM

## 2013-07-05 DIAGNOSIS — E785 Hyperlipidemia, unspecified: Secondary | ICD-10-CM

## 2013-07-05 DIAGNOSIS — N320 Bladder-neck obstruction: Secondary | ICD-10-CM

## 2013-07-05 DIAGNOSIS — D090 Carcinoma in situ of bladder: Secondary | ICD-10-CM

## 2013-07-05 DIAGNOSIS — F0280 Dementia in other diseases classified elsewhere without behavioral disturbance: Secondary | ICD-10-CM

## 2013-07-05 DIAGNOSIS — M109 Gout, unspecified: Secondary | ICD-10-CM

## 2013-07-05 MED ORDER — MEMANTINE HCL 10 MG PO TABS: 10.0000 mg | ORAL_TABLET | Freq: Two times a day (BID) | ORAL | Status: DC

## 2013-07-05 MED ORDER — DONEPEZIL HCL 10 MG PO TABS: 10.0000 mg | ORAL_TABLET | Freq: Every day | ORAL | Status: DC | PRN

## 2013-07-05 NOTE — Progress Notes (Signed)
Subjective:    Patient ID: [name], male    DOB: 1925-12-13, 80 y.o.   MRN: [id number]  HPI  80 year old patient who is in today for followup.  He has a history of dementia and is followed by neurology.  At the present time.  He is off both Aricept and Namenda.  Since his last visit here.  He has had surgery for BPH.  He states that he is voiding well.  He has a history of mild dyslipidemia, history of bladder cancer. He is accompanied by his wife  Past Medical History  Diagnosis Date  . Bladder neck obstruction     HAS FOLEY CATHETER  . Syncope and collapse     due to bradycardia/pauses  . Other and unspecified hyperlipidemia   . Gout, unspecified     GOUT FLARE UP LEFT FOOT ON 02/22/13 - RESOLVED AFTER TAKING 2 DOSES OF INDOCIN  . Bradycardia   . B12 deficiency   . CA in situ bladder   . Hyperlipidemia   . Pacemaker   . GERD (gastroesophageal reflux disease)     RARE - WOULD TAKE TUMS IF NEEDED  . Problems with hearing     WEARS RIGHT HEARING AID  . Dementia     HAS MEMORY PROBLEMS -DEPENDS ON HIS WIFE TO HELP HIM ANSWER QUESTIONS  . Cancer     PAST HX OF POLYPS IN BLADDER - REMOVED - WIFE STATES NOT SURE IF CANCEROUS    History   Social History  . Marital Status: Married    Spouse Name: N/A    Number of Children: N/A  . Years of Education: N/A   Occupational History  . RETIRED Pediatrician    Social History Main Topics  . Smoking status: Former Smoker    Quit date: 02/18/1958  . Smokeless tobacco: Never Used  . Alcohol Use: Yes     Comment: small glass of red wine  - RARELY  . Drug Use: No  . Sexual Activity: Not on file   Other Topics Concern  . Not on file   Social History Narrative   Regular exercise - YES    Past Surgical History  Procedure Laterality Date  . Inguinal hernia repair  2.27.2006    left w/mesh Dr Pedro Earls   . Pacemaker placement  07/28/2008    (Medtronic Adapta L)  . Pacemaker removal  08/2008    Due to MSSA   .  Pacemaker placement  09/2008    re-implantation   . Hernia repair    . Green light laser turp (transurethral resection of prostate N/A 04/06/2013    Procedure: GREEN LIGHT LASER TURP (TRANSURETHRAL RESECTION OF PROSTATE;  Surgeon: [name], MD;  Location: [facility];  Service: Urology;  Laterality: N/A;  BLADDER BIOPSY         Family History  Problem Relation Age of Onset  . Heart disease Other     MI, CHF  . Stroke Other     1st degree relative <50  . Cancer Other     colon    No Known Allergies  Current Outpatient Prescriptions on File Prior to Visit  Medication Sig Dispense Refill  . tamsulosin (FLOMAX) 0.4 MG CAPS capsule Take 0.4 mg by mouth daily. AT BEDTIME      . finasteride (PROSCAR) 5 MG tablet Take 5 mg by mouth daily. AT BEDTIME      . indomethacin (INDOCIN) 50 MG capsule Take 50 mg by  mouth daily as needed (gout).      . memantine (NAMENDA) 5 MG tablet Take 1tab qhs x7d, then 1tab BID x7d, then 1tab qAM & 2tabs qhs x7d, then 2tabs BID.  70 tablet  0   No current facility-administered medications on file prior to visit.    BP 112/70  Pulse 76  Temp(Src) 98.6 F (37 C) (Oral)  Resp 18  Ht 5\' 8"  (1.727 m)  Wt 166 lb (75.297 kg)  BMI 25.25 kg/m2  SpO2 98%       Review of Systems  Constitutional: Negative for fever, chills, appetite change and fatigue.  HENT: Negative for congestion, dental problem, ear pain, hearing loss, sore throat, tinnitus, trouble swallowing and voice change.   Eyes: Negative for pain, discharge and visual disturbance.  Respiratory: Negative for cough, chest tightness, wheezing and stridor.   Cardiovascular: Negative for chest pain, palpitations and leg swelling.  Gastrointestinal: Negative for nausea, vomiting, abdominal pain, diarrhea, constipation, blood in stool and abdominal distention.  Genitourinary: Negative for urgency, hematuria, flank pain, discharge, difficulty urinating and genital sores.  Musculoskeletal:  Negative for arthralgias, back pain, gait problem, joint swelling, myalgias and neck stiffness.  Skin: Negative for rash.  Neurological: Negative for dizziness, syncope, speech difficulty, weakness, numbness and headaches.  Hematological: Negative for adenopathy. Does not bruise/bleed easily.  Psychiatric/Behavioral: Positive for confusion. Negative for behavioral problems and dysphoric mood. The patient is not nervous/anxious.        Objective:   Physical Exam  Constitutional: He is oriented to person, place, and time. He appears well-developed.  Blood pressure 110/70  Wt Readings from Last 3 Encounters: 07/05/13 : 166 lb (75.297 kg) 04/20/13 : 146 lb 4.8 oz (66.361 kg) 04/06/13 : 162 lb 6 oz (73.653 kg)  HENT:  Head: Normocephalic.  Right Ear: External ear normal.  Left Ear: External ear normal.  Eyes: Conjunctivae and EOM are normal.  Neck: Normal range of motion.  Cardiovascular: Normal rate and normal heart sounds.   Pulmonary/Chest: Breath sounds normal.  Abdominal: Bowel sounds are normal.  Musculoskeletal: Normal range of motion. He exhibits no edema and no tenderness.  Neurological: He is alert and oriented to person, place, and time.  Psychiatric: He has a normal mood and affect. His behavior is normal.  Cognitively impaired          Assessment & Plan:   Senile dementia Status post TURP Dyslipidemia History of cardiac dysrhythmia History of gout, stable  We'll resume Aricept and Namenda Discontinue indomethacin, Proscar, Flomax

## 2013-07-05 NOTE — Patient Instructions (Signed)
Return in 6 months for followup    It is important that you exercise regularly, at least 20 minutes 3 to 4 times per week.  If you develop chest pain or shortness of breath seek  medical attention.

## 2013-07-05 NOTE — Progress Notes (Signed)
Pre-visit discussion using our clinic review tool. No additional management support is needed unless otherwise documented below in the visit note.  

## 2013-07-16 ENCOUNTER — Encounter: Payer: Self-pay | Admitting: Neurology

## 2013-07-16 ENCOUNTER — Ambulatory Visit (INDEPENDENT_AMBULATORY_CARE_PROVIDER_SITE_OTHER): Payer: Medicare Other | Admitting: Neurology

## 2013-07-16 VITALS — BP 98/70 | HR 66 | Temp 97.5°F | Resp 14 | Wt 163.4 lb

## 2013-07-16 DIAGNOSIS — F0280 Dementia in other diseases classified elsewhere without behavioral disturbance: Secondary | ICD-10-CM

## 2013-07-16 DIAGNOSIS — G309 Alzheimer's disease, unspecified: Principal | ICD-10-CM

## 2013-07-16 NOTE — Patient Instructions (Signed)
1.  Stop the Aricept and Namenda.  Call in 4 weeks with update and we can restart the Namenda at a lower dose if needed.  I don't want him on Aricept. 2.  Keep to a schedule and get him out of bed and eating meals at same time daily.  If he nods off in the chair, that is fine, but don't let him sleep in bed all day. 3.  Follow up in 3 months.

## 2013-07-16 NOTE — Progress Notes (Signed)
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  He was previously followed by Dr. Drusilla Kanner, who has since left [facility].  Records and images were personally reviewed where available.  He is accompanied by his wife.    UPDATE: Last visit in March, we restarted Namenda.  He had been on it for a month but did not call for refill.  He was doing fine while on it.  He saw his PCP on 07/05/13 who restarted both Namenda and Aricept.  Since that time, he has been more fatigued than usual.  He will go to bed at 8 PM and will wake up well past noon unless his wife gets in out of bed. Even when he is up and about, he usually falls asleep on the chair. He is less conversive.  LTC Claim forms were filled out, however his wife feels that she is able to care for him at this time with the help of friends and family. He did go to daycare once, but did not like it and never went back.  04/20/13:  B12 463.  HISTORY: Mild symptoms were first noticed for several years but became most noticeable in 2010.  At first, he would become disoriented while driving on familiar routes.  He exhibited short-term memory problems such as misplacing objects.  He would go to the grocery store for items and forget what to buy.  Over the past year, this has significantly progressed.  He lives with his wife, who tends to his ADLs.  She has to tell him to take showers and needs to stand by the shower to assist him if needed.  He is unable to dress himself.  She feeds him two meals a day.  His appetite is good.  He has excessive somnolence.  He wakes up around lunch time, which is why he only eats two meals a day.  He is not depressed.  He does not hallucinate.  He does check to see if the doors are locked at  night a couple of times before retiring to bed, but he is not paranoid.  He does not drive.  There has been no significant change in personality other than being less energetic.  He is not more irritable.  He is not abusive or combative.  He is still very sweet.  One time, he asked his daughter to remind him his wife's name.  Another time, he put a glove in the oven and left it on.  He typically sits around and sleeps all day.  Neuropsychological testing performed on 03/18/11 revealed marked impairment in memory and certain other cognitive domains, suggestive of probable Alzheimer's dementia.  Neurocognitive testing from 09/17/11 revealed impaired immediate memory, story memory, semantic fluency, list recall, list recognition, story recall, figure recall, and clock drawing test, was consistent with dementia.  Re-evaluation from 09/15/12 revealed further decline most notably in contextual verbal memory, visuospatial functioning and aspects of executive functioning.  MMSE score that day was 19/30.  He has history of B12 deficiency and last level performed on 01/30/11 was 268.  He is no longer on supplementation.  Last TSH performed same time was 4.66.  He was taking Aricept up until several months ago, because they were unable to get a refill.    He  and his wife live alone.  They have two sons that live in [place], but unable to provide regular help for their mother.  Their daughter lives an hour and a half away.  His wife feels she needs some help to care for him, because he requires so much of her time.  She is afraid to leave him home alone while she goes out to run errands.  MMSE from 04/20/13 was 22/30.  PAST MEDICAL HISTORY: Past Medical History  Diagnosis Date  . Bladder neck obstruction     HAS FOLEY CATHETER  . Syncope and collapse     due to bradycardia/pauses  . Other and unspecified hyperlipidemia   . Gout, unspecified     GOUT FLARE UP LEFT FOOT ON 02/22/13 - RESOLVED AFTER TAKING 2 DOSES OF  INDOCIN  . Bradycardia   . B12 deficiency   . CA in situ bladder   . Hyperlipidemia   . Pacemaker   . GERD (gastroesophageal reflux disease)     RARE - WOULD TAKE TUMS IF NEEDED  . Problems with hearing     WEARS RIGHT HEARING AID  . Dementia     HAS MEMORY PROBLEMS -DEPENDS ON HIS WIFE TO HELP HIM ANSWER QUESTIONS  . Cancer     PAST HX OF POLYPS IN BLADDER - REMOVED - WIFE STATES NOT SURE IF CANCEROUS    MEDICATIONS: Current Outpatient Prescriptions on File Prior to Visit  Medication Sig Dispense Refill  . donepezil (ARICEPT) 10 MG tablet Take 1 tablet (10 mg total) by mouth daily as needed (alzheimer).  90 tablet  5  . memantine (NAMENDA) 10 MG tablet Take 1 tablet (10 mg total) by mouth 2 (two) times daily.  180 tablet  6   No current facility-administered medications on file prior to visit.    ALLERGIES: No Known Allergies  FAMILY HISTORY: Family History  Problem Relation Age of Onset  . Heart disease Other     MI, CHF  . Stroke Other     1st degree relative <50  . Cancer Other     colon    SOCIAL HISTORY: History   Social History  . Marital Status: Married    Spouse Name: N/A    Number of Children: N/A  . Years of Education: N/A   Occupational History  . RETIRED Pediatrician    Social History Main Topics  . Smoking status: Former Smoker    Quit date: 02/18/1958  . Smokeless tobacco: Never Used  . Alcohol Use: Yes     Comment: small glass of red wine  - RARELY  . Drug Use: No  . Sexual Activity: Not on file   Other Topics Concern  . Not on file   Social History Narrative   Regular exercise - YES    REVIEW OF SYSTEMS: Constitutional: No fevers, chills, or sweats, no generalized fatigue, change in appetite Eyes: No visual changes, double vision, eye pain Ear, nose and throat: No hearing loss, ear pain, nasal congestion, sore throat Cardiovascular: No chest pain, palpitations Respiratory:  No shortness of breath at rest or with exertion,  wheezes GastrointestinaI: No nausea, vomiting, diarrhea, abdominal pain, fecal incontinence Genitourinary:  No dysuria, urinary retention or frequency Musculoskeletal:  No neck pain, back pain Integumentary: No rash, pruritus, skin lesions Neurological: as above Psychiatric: No depression, insomnia, anxiety Endocrine: No palpitations, fatigue, diaphoresis, mood swings, change in appetite, change in weight, increased thirst Hematologic/Lymphatic:  No anemia, purpura, petechiae. Allergic/Immunologic: no itchy/runny eyes, nasal  congestion, recent allergic reactions, rashes  PHYSICAL EXAM: Filed Vitals:   07/16/13 0931  BP: 98/70  Pulse: 66  Temp: 97.5 F (36.4 C)  Resp: 14   General: No acute distress Head:  Normocephalic/atraumatic Neck: supple, no paraspinal tenderness, full range of motion Heart:  Regular rate and rhythm Lungs:  Clear to auscultation bilaterally Back: No paraspinal tenderness Neurological Exam: alert and oriented to person and  place, but not time. Attention span and concentration mildly impaired, recent memory impaired, remote memory intact, fund of knowledge intact.  Speech fluent and not dysarthric, although there is paucity, language intact.  CN II-XII intact. Fundi not visualized.  Bulk and tone normal, muscle strength 5/5 throughout.  Sensation to light touch intact..  Gait normal.  IMPRESSION: Alzheimer's disease  PLAN: 1.  Will stop Aricept and Namenda.  Will wait a month and have wife call.  If he is stable, we can try restarting Namenda at lower dose for 4 weeks and then increase to 10mg  twice daily.  I would not restart Aricept as it would likely not be helpful 2.  Try to keep him on a schedule in terms of getting out of bed and meals.  If he nods off in the chair, that is fine, as long as he doesn't spend all day sleeping in bed. 3.  Follow up in 3 months.  30 minutes spent with patient and wife, over 50% spent counseling and coordinating care.  Metta Clines, DO  CC: Bluford Kaufmann, MD

## 2013-07-19 ENCOUNTER — Ambulatory Visit (INDEPENDENT_AMBULATORY_CARE_PROVIDER_SITE_OTHER): Payer: Medicare Other | Admitting: *Deleted

## 2013-07-19 ENCOUNTER — Encounter: Payer: Self-pay | Admitting: Internal Medicine

## 2013-07-19 DIAGNOSIS — R001 Bradycardia, unspecified: Secondary | ICD-10-CM

## 2013-07-19 DIAGNOSIS — I498 Other specified cardiac arrhythmias: Secondary | ICD-10-CM

## 2013-07-19 LAB — MDC_IDC_ENUM_SESS_TYPE_INCLINIC
Battery Impedance: 252 Ohm
Battery Remaining Longevity: 126 mo
Battery Voltage: 2.8 V
Brady Statistic AP VP Percent: 0 %
Brady Statistic AP VS Percent: 43 %
Brady Statistic AS VP Percent: 1 %
Brady Statistic AS VS Percent: 56 %
Date Time Interrogation Session: 20150601155650
Lead Channel Impedance Value: 363 Ohm
Lead Channel Impedance Value: 485 Ohm
Lead Channel Pacing Threshold Amplitude: 0.75 V
Lead Channel Pacing Threshold Pulse Width: 0.4 ms
Lead Channel Sensing Intrinsic Amplitude: 4 mV
Lead Channel Setting Pacing Amplitude: 2.5 V
Lead Channel Setting Sensing Sensitivity: 4 mV
MDC IDC MSMT LEADCHNL RV PACING THRESHOLD AMPLITUDE: 0.75 V
MDC IDC MSMT LEADCHNL RV PACING THRESHOLD PULSEWIDTH: 0.4 ms
MDC IDC MSMT LEADCHNL RV SENSING INTR AMPL: 11.2 mV
MDC IDC SET LEADCHNL RA PACING AMPLITUDE: 2 V
MDC IDC SET LEADCHNL RV PACING PULSEWIDTH: 0.4 ms

## 2013-07-19 NOTE — Progress Notes (Signed)
Pacemaker check in clinic. Normal device function. Thresholds, sensing, impedances consistent with previous measurements. Device programmed to maximize longevity. No mode switch or high ventricular rates noted. Device programmed at appropriate safety margins. Histogram distribution appropriate for patient activity level. Device programmed to optimize intrinsic conduction. Estimated longevity 10.5 years. ROV in 6 mths w/JA.

## 2013-09-01 ENCOUNTER — Telehealth: Payer: Self-pay | Admitting: Neurology

## 2013-09-01 NOTE — Telephone Encounter (Signed)
Left a message for patient to return call  Before 11;30 am if they did not reach me before then I would return call in am

## 2013-09-01 NOTE — Telephone Encounter (Signed)
Please call patient wife this afternoon at [phone] it is about some test

## 2013-09-02 NOTE — Telephone Encounter (Signed)
Pt's spouse called returning your call. C/B [phone]

## 2013-09-03 ENCOUNTER — Telehealth: Payer: Self-pay | Admitting: Neurology

## 2013-09-03 NOTE — Telephone Encounter (Signed)
Wife called, has concerns about pt getting neuropsych testing. Please call [phone] / Sherri S.

## 2013-09-03 NOTE — Telephone Encounter (Signed)
Patients wife does not feel like he really needs neuropsych testing she feels he is declining he is sleep a lot now and has not much strength . She wants to know if you really feel that it is needed ? Please advise

## 2013-09-06 NOTE — Telephone Encounter (Signed)
Called. Patient's wife was notified of advisement.

## 2013-09-06 NOTE — Telephone Encounter (Signed)
If it would be difficult to physically perform neuropsych testing, then it doesn't absolutely need to be done.  Just be sure that they call them to cancel.

## 2013-10-20 ENCOUNTER — Encounter: Payer: Self-pay | Admitting: Neurology

## 2013-10-20 ENCOUNTER — Ambulatory Visit (INDEPENDENT_AMBULATORY_CARE_PROVIDER_SITE_OTHER): Payer: Medicare Other | Admitting: Neurology

## 2013-10-20 VITALS — BP 118/70 | HR 66 | Temp 98.0°F | Resp 16 | Wt 161.5 lb

## 2013-10-20 DIAGNOSIS — G309 Alzheimer's disease, unspecified: Principal | ICD-10-CM

## 2013-10-20 DIAGNOSIS — F0280 Dementia in other diseases classified elsewhere without behavioral disturbance: Secondary | ICD-10-CM

## 2013-10-20 NOTE — Progress Notes (Signed)
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  He is accompanied by his wife.    UPDATE: He had been more fatigued on Aricept and Namenda, so both agents were stopped to see if the fatigue would resolve.  He no longer has loose stool, but he still sleeps often.  He goes to bed at around 9pm.  She let's him sleep until noon so she can get things done at home in the morning and she can have time to herself.  She says if she didn't wake him up, he would sleep all day.  She would try and bring him  along to do errands.  He has been stable and his mood is good.  He is very pleasant.  He still recognizes people but the short-term memory loss has progressed a little.  Somebody will be starting to come to the house for two afternoons a week to help out.  He had a fall a little while ago and hurt his shoulder.  It still feels a little bruised and he cannot abduct his arm as well.  HISTORY: Mild symptoms were first noticed for several years but became most noticeable in 2010.  At first, he would become disoriented while driving on familiar routes.  He exhibited short-term memory problems such as misplacing objects.  He would go to the grocery store for items and forget what to buy.  Over the past year, this has significantly progressed.  He lives with his wife, who tends to his ADLs.  She has to tell him to take showers and needs to stand by the shower to assist him if needed.  He is unable to dress himself.  She feeds him two meals a day.  His appetite is good.  He has excessive somnolence.  He wakes up around lunch time, which is why he only eats two meals a day.  He is not depressed.  He does not hallucinate.  He does check to see if the doors are locked at night a couple of  times before retiring to bed, but he is not paranoid.  He does not drive.  There has been no significant change in personality other than being less energetic.  He is not more irritable.  He is not abusive or combative.  He is still very sweet.  One time, he asked his daughter to remind him his wife's name.  Another time, he put a glove in the oven and left it on.  He typically sits around and sleeps all day.  Neuropsychological testing performed on 03/18/11 revealed marked impairment in memory and certain other cognitive domains, suggestive of probable Alzheimer's dementia.  Neurocognitive testing from 09/17/11 revealed impaired immediate memory, story memory, semantic fluency, list recall, list recognition, story recall, figure recall, and clock drawing test, was consistent with dementia.  Re-evaluation from 09/15/12 revealed further decline most notably in contextual verbal memory, visuospatial functioning and aspects of executive functioning.  He has history of B12 deficiency and last level performed on 04/20/13 was 463.  He is no longer on supplementation.  Last TSH performed 01/30/11 was 4.66.  He previously took Aricept.  He and his wife live alone.  They have two sons that live in [place], but unable to provide regular help for their mother.  Their daughter lives an hour and a half away.    MMSE from 04/20/13 was 22/30.  PAST MEDICAL HISTORY: Past Medical History  Diagnosis Date  . Bladder neck obstruction     HAS FOLEY CATHETER  . Syncope and collapse     due to bradycardia/pauses  . Other and unspecified hyperlipidemia   . Gout, unspecified     GOUT FLARE UP LEFT FOOT ON 02/22/13 - RESOLVED AFTER TAKING 2 DOSES OF INDOCIN  . Bradycardia   . B12 deficiency   . CA in situ bladder   . Hyperlipidemia   . Pacemaker   . GERD (gastroesophageal reflux disease)     RARE - WOULD TAKE TUMS IF NEEDED  . Problems with hearing     WEARS RIGHT HEARING AID  . Dementia     HAS MEMORY PROBLEMS -DEPENDS  ON HIS WIFE TO HELP HIM ANSWER QUESTIONS  . Cancer     PAST HX OF POLYPS IN BLADDER - REMOVED - WIFE STATES NOT SURE IF CANCEROUS    MEDICATIONS: Current Outpatient Prescriptions on File Prior to Visit  Medication Sig Dispense Refill  . tamsulosin (FLOMAX) 0.4 MG CAPS capsule Take 0.4 mg by mouth daily.       No current facility-administered medications on file prior to visit.    ALLERGIES: No Known Allergies  FAMILY HISTORY: Family History  Problem Relation Age of Onset  . Heart disease Other     MI, CHF  . Stroke Other     1st degree relative <50  . Cancer Other     colon    SOCIAL HISTORY: History   Social History  . Marital Status: Married    Spouse Name: N/A    Number of Children: N/A  . Years of Education: N/A   Occupational History  . RETIRED Pediatrician    Social History Main Topics  . Smoking status: Former Smoker    Quit date: 02/18/1958  . Smokeless tobacco: Never Used  . Alcohol Use: Yes     Comment: small glass of red wine  - RARELY  . Drug Use: No  . Sexual Activity: Not Currently   Other Topics Concern  . Not on file   Social History Narrative   Regular exercise - YES    REVIEW OF SYSTEMS: Constitutional: No fevers, chills, or sweats, no generalized fatigue, change in appetite Eyes: No visual changes, double vision, eye pain Ear, nose and throat: No hearing loss, ear pain, nasal congestion, sore throat Cardiovascular: No chest pain, palpitations Respiratory:  No shortness of breath at rest or with exertion, wheezes GastrointestinaI: No nausea, vomiting, diarrhea, abdominal pain, fecal incontinence Genitourinary:  No dysuria, urinary retention or frequency Musculoskeletal:  No neck pain, back pain Integumentary: No rash, pruritus, skin lesions Neurological: as above Psychiatric: No depression, insomnia, anxiety Endocrine: No palpitations, fatigue, diaphoresis, mood swings, change in appetite, change in weight, increased  thirst Hematologic/Lymphatic:  No anemia, purpura, petechiae. Allergic/Immunologic: no itchy/runny eyes, nasal congestion, recent allergic reactions, rashes  PHYSICAL EXAM: Filed Vitals:   10/20/13 1127  BP: 118/70  Pulse: 66  Temp: 98 F (36.7 C)  Resp: 16   General: No acute distress Head:  Normocephalic/atraumatic Neck: supple, no paraspinal tenderness, full range of motion Heart:  Regular rate and rhythm Lungs:  Clear to auscultation bilaterally Back: No paraspinal tenderness Neurological Exam: alert and oriented to person, place, but not time. Attention span and concentration somewhat impaired, remote memory intact, fund of knowledge somewhat impaired.  MMSE - Mini Mental State Exam 10/20/2013 01/30/2011  Orientation to time 0 5  Orientation to Place 3 5  Registration 3 3  Attention/ Calculation 4 5  Recall 2 3  Language- name 2 objects 2 2  Language- repeat 1 1  Language- follow 3 step command 3 3  Language- read & follow direction 1 1  Write a sentence 1 1  Copy design 1 1  Total score 21 30   Speech fluent and not dysarthric, language intact.  CN II-XII intact. Fundoscopic exam unremarkable without vessel changes, exudates, hemorrhages or papilledema.  Bulk and tone normal, muscle strength 5/5 throughout although limited in the left shoulder due to mild pain.  Gait steady.  IMPRESSION: Alzheimer's disease, stable.  PLAN: 1.  We will not initiate Aricept or Namenda at this time.  He is doing well and the side effects may outweigh the benefits at this time. 2.  Encourage that he go out with his wife to run errands sometimes, and to go for walks together, in order to keep him active 3.  Discuss with PCP regarding the shoulder. 4.  Follow up in 6 months.  Metta Clines, DO  CC: Bluford Kaufmann, MD

## 2013-10-20 NOTE — Patient Instructions (Signed)
Follow up in 6 months 

## 2013-10-22 ENCOUNTER — Ambulatory Visit: Payer: Medicare Other | Admitting: Neurology

## 2014-01-06 ENCOUNTER — Encounter: Payer: Self-pay | Admitting: Internal Medicine

## 2014-01-06 ENCOUNTER — Ambulatory Visit (INDEPENDENT_AMBULATORY_CARE_PROVIDER_SITE_OTHER): Payer: Medicare Other | Admitting: Internal Medicine

## 2014-01-06 VITALS — BP 116/70 | HR 75 | Temp 98.3°F | Resp 18 | Ht 68.0 in | Wt 159.0 lb

## 2014-01-06 DIAGNOSIS — G309 Alzheimer's disease, unspecified: Secondary | ICD-10-CM

## 2014-01-06 DIAGNOSIS — R634 Abnormal weight loss: Secondary | ICD-10-CM

## 2014-01-06 DIAGNOSIS — E538 Deficiency of other specified B group vitamins: Secondary | ICD-10-CM

## 2014-01-06 DIAGNOSIS — F0280 Dementia in other diseases classified elsewhere without behavioral disturbance: Secondary | ICD-10-CM

## 2014-01-06 DIAGNOSIS — Z23 Encounter for immunization: Secondary | ICD-10-CM

## 2014-01-06 LAB — CBC WITH DIFFERENTIAL/PLATELET
BASOS ABS: 0 10*3/uL (ref 0.0–0.1)
Basophils Relative: 0.4 % (ref 0.0–3.0)
Eosinophils Absolute: 0.4 10*3/uL (ref 0.0–0.7)
Eosinophils Relative: 4.2 % (ref 0.0–5.0)
HCT: 41.1 % (ref 39.0–52.0)
Hemoglobin: 13.5 g/dL (ref 13.0–17.0)
LYMPHS PCT: 23.9 % (ref 12.0–46.0)
Lymphs Abs: 2 10*3/uL (ref 0.7–4.0)
MCHC: 32.9 g/dL (ref 30.0–36.0)
MCV: 89.5 fl (ref 78.0–100.0)
Monocytes Absolute: 0.8 10*3/uL (ref 0.1–1.0)
Monocytes Relative: 8.9 % (ref 3.0–12.0)
NEUTROS ABS: 5.3 10*3/uL (ref 1.4–7.7)
NEUTROS PCT: 62.6 % (ref 43.0–77.0)
Platelets: 253 10*3/uL (ref 150.0–400.0)
RBC: 4.59 Mil/uL (ref 4.22–5.81)
RDW: 13.8 % (ref 11.5–15.5)
WBC: 8.4 10*3/uL (ref 4.0–10.5)

## 2014-01-06 LAB — TSH: TSH: 3.7 u[IU]/mL (ref 0.35–4.50)

## 2014-01-06 NOTE — Progress Notes (Signed)
Subjective:    Patient ID: [name], male    DOB: 07-14-1925, 80 y.o.   MRN: [id number]  HPI  Wt Readings from Last 3 Encounters:  01/06/14 159 lb (72.122 kg)  10/20/13 161 lb 8 oz (73.256 kg)  07/16/13 163 lb 6.4 oz (74.68 kg)   80 year old patient who is seen today for follow-up.  He is followed by neurology.  for Alzheimer's dementia.  He has a history of B12 deficiency and has received B12 injections in the past.  He has a history of BPH and is status post TURP in February of this year.  Presently off all medications Wife is concerned about some mild weight loss.  He seems to have a fairly good appetite but sleeps through breakfast and basically consumes just 2 meals daily.  She feels that he has become slightly more unsteady  Past Medical History  Diagnosis Date  . Bladder neck obstruction     HAS FOLEY CATHETER  . Syncope and collapse     due to bradycardia/pauses  . Other and unspecified hyperlipidemia   . Gout, unspecified     GOUT FLARE UP LEFT FOOT ON 02/22/13 - RESOLVED AFTER TAKING 2 DOSES OF INDOCIN  . Bradycardia   . B12 deficiency   . CA in situ bladder   . Hyperlipidemia   . Pacemaker   . GERD (gastroesophageal reflux disease)     RARE - WOULD TAKE TUMS IF NEEDED  . Problems with hearing     WEARS RIGHT HEARING AID  . Dementia     HAS MEMORY PROBLEMS -DEPENDS ON HIS WIFE TO HELP HIM ANSWER QUESTIONS  . Cancer     PAST HX OF POLYPS IN BLADDER - REMOVED - WIFE STATES NOT SURE IF CANCEROUS    History   Social History  . Marital Status: Married    Spouse Name: N/A    Number of Children: N/A  . Years of Education: N/A   Occupational History  . RETIRED Pediatrician    Social History Main Topics  . Smoking status: Former Smoker    Quit date: 02/18/1958  . Smokeless tobacco: Never Used  . Alcohol Use: Yes     Comment: small glass of red wine  - RARELY  . Drug Use: No  . Sexual Activity: Not Currently   Other Topics Concern  . Not on file    Social History Narrative   Regular exercise - YES    Past Surgical History  Procedure Laterality Date  . Inguinal hernia repair  2.27.2006    left w/mesh Dr Pedro Earls   . Pacemaker placement  07/28/2008    (Medtronic Adapta L)  . Pacemaker removal  08/2008    Due to MSSA   . Pacemaker placement  09/2008    re-implantation   . Hernia repair    . Green light laser turp (transurethral resection of prostate N/A 04/06/2013    Procedure: GREEN LIGHT LASER TURP (TRANSURETHRAL RESECTION OF PROSTATE;  Surgeon: [name], MD;  Location: [facility];  Service: Urology;  Laterality: N/A;  BLADDER BIOPSY         Family History  Problem Relation Age of Onset  . Heart disease Other     MI, CHF  . Stroke Other     1st degree relative <50  . Cancer Other     colon    No Known Allergies  Current Outpatient Prescriptions on File Prior to Visit  Medication Sig Dispense  Refill  . tamsulosin (FLOMAX) 0.4 MG CAPS capsule Take 0.4 mg by mouth daily.     No current facility-administered medications on file prior to visit.    BP 116/70 mmHg  Pulse 75  Temp(Src) 98.3 F (36.8 C) (Oral)  Resp 18  Ht 5\' 8"  (1.727 m)  Wt 159 lb (72.122 kg)  BMI 24.18 kg/m2  SpO2 98%      Review of Systems  Constitutional: Negative for fever, chills, appetite change and fatigue.  HENT: Negative for congestion, dental problem, ear pain, hearing loss, sore throat, tinnitus, trouble swallowing and voice change.   Eyes: Negative for pain, discharge and visual disturbance.  Respiratory: Negative for cough, chest tightness, wheezing and stridor.   Cardiovascular: Negative for chest pain, palpitations and leg swelling.  Gastrointestinal: Negative for nausea, vomiting, abdominal pain, diarrhea, constipation, blood in stool and abdominal distention.  Genitourinary: Negative for urgency, hematuria, flank pain, discharge, difficulty urinating and genital sores.  Musculoskeletal: Positive for  gait problem. Negative for myalgias, back pain, joint swelling, arthralgias and neck stiffness.  Skin: Negative for rash.  Neurological: Negative for dizziness, syncope, speech difficulty, weakness, numbness and headaches.  Hematological: Negative for adenopathy. Does not bruise/bleed easily.  Psychiatric/Behavioral: Positive for confusion and decreased concentration. Negative for behavioral problems and dysphoric mood. The patient is not nervous/anxious.        Objective:   Physical Exam  Constitutional: He appears well-developed.  Blood pressure low normal  HENT:  Head: Normocephalic.  Right Ear: External ear normal.  Left Ear: External ear normal.  Eyes: Conjunctivae and EOM are normal.  Neck: Normal range of motion.  Cardiovascular: Normal rate and normal heart sounds.   Pulmonary/Chest: Breath sounds normal.  Abdominal: Bowel sounds are normal.  Musculoskeletal: Normal range of motion. He exhibits no edema or tenderness.  Neurological: He is alert.  Moderate cognitive impairment  Psychiatric: He has a normal mood and affect. His behavior is normal.          Assessment & Plan:   Senile dementia Mild weight loss History of B12 deficiency  We'll check screening lab Recheck 6 months or as needed

## 2014-01-06 NOTE — Progress Notes (Signed)
Pre visit review using our clinic review tool, if applicable. No additional management support is needed unless otherwise documented below in the visit note. 

## 2014-01-06 NOTE — Patient Instructions (Signed)
Vitamin B12  1000 g (1 mg) daily    It is important that you exercise regularly, at least 20 minutes 3 to 4 times per week.  If you develop chest pain or shortness of breath seek  medical attention.  Return in 6 months for follow-up

## 2014-01-07 LAB — COMPREHENSIVE METABOLIC PANEL
ALBUMIN: 3.3 g/dL — AB (ref 3.5–5.2)
ALT: 12 U/L (ref 0–53)
AST: 16 U/L (ref 0–37)
Alkaline Phosphatase: 58 U/L (ref 39–117)
BUN: 21 mg/dL (ref 6–23)
CALCIUM: 9 mg/dL (ref 8.4–10.5)
CHLORIDE: 106 meq/L (ref 96–112)
CO2: 25 mEq/L (ref 19–32)
Creatinine, Ser: 1.5 mg/dL (ref 0.4–1.5)
GFR: 48.27 mL/min — ABNORMAL LOW (ref >60.00)
Glucose, Bld: 85 mg/dL (ref 70–99)
POTASSIUM: 4.4 meq/L (ref 3.5–5.1)
SODIUM: 140 meq/L (ref 135–145)
TOTAL PROTEIN: 7.5 g/dL (ref 6.0–8.3)
Total Bilirubin: 0.4 mg/dL (ref 0.2–1.2)

## 2014-01-19 ENCOUNTER — Ambulatory Visit (INDEPENDENT_AMBULATORY_CARE_PROVIDER_SITE_OTHER): Payer: Medicare Other | Admitting: Internal Medicine

## 2014-01-19 ENCOUNTER — Encounter: Payer: Self-pay | Admitting: Internal Medicine

## 2014-01-19 VITALS — BP 110/58 | HR 85 | Ht 68.0 in | Wt 157.8 lb

## 2014-01-19 DIAGNOSIS — R55 Syncope and collapse: Secondary | ICD-10-CM

## 2014-01-19 DIAGNOSIS — Z950 Presence of cardiac pacemaker: Secondary | ICD-10-CM

## 2014-01-19 DIAGNOSIS — F0280 Dementia in other diseases classified elsewhere without behavioral disturbance: Secondary | ICD-10-CM

## 2014-01-19 DIAGNOSIS — R001 Bradycardia, unspecified: Secondary | ICD-10-CM

## 2014-01-19 DIAGNOSIS — G309 Alzheimer's disease, unspecified: Secondary | ICD-10-CM

## 2014-01-19 LAB — MDC_IDC_ENUM_SESS_TYPE_INCLINIC
Battery Remaining Longevity: 123 mo
Brady Statistic AP VP Percent: 0 %
Brady Statistic AP VS Percent: 43 %
Brady Statistic AS VP Percent: 1 %
Brady Statistic AS VS Percent: 56 %
Lead Channel Pacing Threshold Amplitude: 0.75 V
Lead Channel Pacing Threshold Amplitude: 1 V
Lead Channel Pacing Threshold Pulse Width: 0.4 ms
Lead Channel Pacing Threshold Pulse Width: 0.4 ms
Lead Channel Sensing Intrinsic Amplitude: 8 mV
Lead Channel Setting Pacing Amplitude: 2.5 V
Lead Channel Setting Pacing Pulse Width: 0.4 ms
Lead Channel Setting Sensing Sensitivity: 4 mV
MDC IDC MSMT BATTERY IMPEDANCE: 276 Ohm
MDC IDC MSMT BATTERY VOLTAGE: 2.8 V
MDC IDC MSMT LEADCHNL RA IMPEDANCE VALUE: 362 Ohm
MDC IDC MSMT LEADCHNL RA SENSING INTR AMPL: 4 mV
MDC IDC MSMT LEADCHNL RV IMPEDANCE VALUE: 475 Ohm
MDC IDC SESS DTM: 20151202124007
MDC IDC SET LEADCHNL RA PACING AMPLITUDE: 2 V

## 2014-01-19 NOTE — Patient Instructions (Signed)
Your physician wants you to follow-up in: 6 months in device clinic and 12 months with Dr. Allred. You will receive a reminder letter in the mail two months in advance. If you don't receive a letter, please call our office to schedule the follow-up appointment.     

## 2014-01-19 NOTE — Progress Notes (Signed)
PCP: Nyoka Cowden, MD  [name] is a 80 y.o. male who presents today for routine electrophysiology followup.  Since last being seen in our clinic, the patient reports doing reasonably well.   His wife says he is more dependant on her everyday.  Today, he denies symptoms of palpitations, chest pain, shortness of breath,  lower extremity edema, dizziness, presyncope, or syncope.  The patient is otherwise without complaint today.   Past Medical History  Diagnosis Date  . Bladder neck obstruction     HAS FOLEY CATHETER  . Syncope and collapse     due to bradycardia/pauses  . Other and unspecified hyperlipidemia   . Gout, unspecified     GOUT FLARE UP LEFT FOOT ON 02/22/13 - RESOLVED AFTER TAKING 2 DOSES OF INDOCIN  . Bradycardia   . B12 deficiency   . CA in situ bladder   . Hyperlipidemia   . Pacemaker   . GERD (gastroesophageal reflux disease)     RARE - WOULD TAKE TUMS IF NEEDED  . Problems with hearing     WEARS RIGHT HEARING AID  . Dementia     HAS MEMORY PROBLEMS -DEPENDS ON HIS WIFE TO HELP HIM ANSWER QUESTIONS  . Cancer     PAST HX OF POLYPS IN BLADDER - REMOVED - WIFE STATES NOT SURE IF CANCEROUS   Past Surgical History  Procedure Laterality Date  . Inguinal hernia repair  2.27.2006    left w/mesh Dr Pedro Earls   . Pacemaker placement  07/28/2008    (Medtronic Adapta L)  . Pacemaker removal  08/2008    Due to MSSA   . Pacemaker placement  09/2008    re-implantation   . Hernia repair    . Green light laser turp (transurethral resection of prostate N/A 04/06/2013    Procedure: GREEN LIGHT LASER TURP (TRANSURETHRAL RESECTION OF PROSTATE;  Surgeon: [name], MD;  Location: [facility];  Service: Urology;  Laterality: N/A;  BLADDER BIOPSY         No current outpatient prescriptions on file.   No current facility-administered medications for this visit.    Physical Exam: Filed Vitals:   01/19/14 1204  BP: 110/58  Pulse: 85  Height: 5'  8" (1.727 m)  Weight: 157 lb 12.8 oz (71.578 kg)    GEN- The patient is well appearing, alert and oriented x 3 today.   Head- normocephalic, atraumatic Eyes-  Sclera clear, conjunctiva pink Ears- hearing intact Oropharynx- clear Lungs- Clear to ausculation bilaterally, normal work of breathing Chest- pacemaker pocket is well healed Heart- Regular rate and rhythm, no murmurs, rubs or gallops, PMI not laterally displaced GI- soft, NT, ND, + BS Extremities- no clubbing, cyanosis, or edema  Pacemaker interrogation- reviewed in detail today,  See PACEART report  Assessment and Plan:  1. Symptomatic bradycardia/ sick sinus syndrome Normal pacemaker function See Pace Art report No changes today  2. Dementia Stable but advanced I have provided support in discussion with his wife today  Return to the device clinic in 6 months I will see in a year

## 2014-03-03 ENCOUNTER — Encounter (HOSPITAL_COMMUNITY): Payer: Self-pay | Admitting: Urology

## 2014-04-20 ENCOUNTER — Ambulatory Visit: Payer: Medicare Other | Admitting: Neurology

## 2014-05-27 ENCOUNTER — Encounter: Payer: Self-pay | Admitting: Neurology

## 2014-05-27 ENCOUNTER — Ambulatory Visit (INDEPENDENT_AMBULATORY_CARE_PROVIDER_SITE_OTHER): Payer: Medicare Other | Admitting: Neurology

## 2014-05-27 VITALS — BP 102/66 | HR 66 | Resp 16 | Ht 68.0 in | Wt 157.6 lb

## 2014-05-27 DIAGNOSIS — G309 Alzheimer's disease, unspecified: Secondary | ICD-10-CM

## 2014-05-27 DIAGNOSIS — F0280 Dementia in other diseases classified elsewhere without behavioral disturbance: Secondary | ICD-10-CM

## 2014-05-27 NOTE — Progress Notes (Addendum)
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  He is accompanied by his wife who provides history.    UPDATE: There have been no significant changes.  He is doing well.  He is very pleasant.  He does not wander.  He is not combative.    HISTORY: Mild symptoms were first noticed for several years but became most noticeable in 2010.  At first, he would become disoriented while driving on familiar routes.  He exhibited short-term memory problems such as misplacing objects.  He would go to the grocery store for items and forget what to buy.  Over the past year, this has significantly progressed.  He lives with his wife, who tends to his ADLs.  She has to tell him to take showers and needs to stand by the shower to assist him if needed.  He is unable to dress himself.  She feeds him two meals a day.  His appetite is good.  He has excessive somnolence.  He wakes up around lunch time, which is why he only eats two meals a day.  He is not depressed.  He does not hallucinate.  He does check to see if the doors are locked at night a couple of times before retiring to bed, but he is not paranoid.  He does not drive.  There has been no significant change in personality other than being less energetic.  He is not more irritable.  He is not abusive or combative.  He is still very sweet.  One time, he asked his daughter to remind him his wife's name.  Another time, he put a glove in the oven and left it on.  He typically sits around and sleeps all day.  Neuropsychological testing performed on 03/18/11 revealed marked impairment in memory and certain other cognitive domains, suggestive of probable Alzheimer's dementia.  Neurocognitive testing from 09/17/11 revealed impaired immediate memory,  story memory, semantic fluency, list recall, list recognition, story recall, figure recall, and clock drawing test, was consistent with dementia.  Re-evaluation from 09/15/12 revealed further decline most notably in contextual verbal memory, visuospatial functioning and aspects of executive functioning.    Namenda and Aricept were discontinued due to somnolence.    He and his wife live alone.  They have two sons that live in [place].  One of their sons moved in with them.  PAST MEDICAL HISTORY: Past Medical History  Diagnosis Date  . Bladder neck obstruction     HAS FOLEY CATHETER  . Syncope and collapse     due to bradycardia/pauses  . Other and unspecified hyperlipidemia   . Gout, unspecified     GOUT FLARE UP LEFT FOOT ON 02/22/13 - RESOLVED AFTER TAKING 2 DOSES OF INDOCIN  . Bradycardia   . B12 deficiency   . CA in situ bladder   . Hyperlipidemia   . Pacemaker   . GERD (gastroesophageal reflux disease)     RARE - WOULD TAKE TUMS IF NEEDED  . Problems with hearing     WEARS RIGHT HEARING AID  . Dementia     HAS MEMORY PROBLEMS -DEPENDS ON HIS WIFE TO HELP HIM ANSWER QUESTIONS  . Cancer     PAST HX OF POLYPS IN BLADDER - REMOVED - WIFE STATES NOT SURE IF  CANCEROUS    MEDICATIONS: No current outpatient prescriptions on file prior to visit.   No current facility-administered medications on file prior to visit.    ALLERGIES: No Known Allergies  FAMILY HISTORY: Family History  Problem Relation Age of Onset  . Heart disease Other     MI, CHF  . Stroke Other     1st degree relative <50  . Cancer Other     colon    SOCIAL HISTORY: History   Social History  . Marital Status: Married    Spouse Name: N/A  . Number of Children: N/A  . Years of Education: N/A   Occupational History  . RETIRED Pediatrician    Social History Main Topics  . Smoking status: Former Smoker    Quit date: 02/18/1958  . Smokeless tobacco: Never Used  . Alcohol Use: Yes     Comment:  small glass of red wine  - RARELY  . Drug Use: No  . Sexual Activity: Not Currently   Other Topics Concern  . Not on file   Social History Narrative   Regular exercise - YES    REVIEW OF SYSTEMS: Constitutional: No fevers, chills, or sweats, no generalized fatigue, change in appetite Eyes: No visual changes, double vision, eye pain Ear, nose and throat: No hearing loss, ear pain, nasal congestion, sore throat Cardiovascular: No chest pain, palpitations Respiratory:  No shortness of breath at rest or with exertion, wheezes GastrointestinaI: No nausea, vomiting, diarrhea, abdominal pain, fecal incontinence Genitourinary:  No dysuria, urinary retention or frequency Musculoskeletal:  No neck pain, back pain Integumentary: No rash, pruritus, skin lesions Neurological: as above Psychiatric: No depression, insomnia, anxiety Endocrine: No palpitations, fatigue, diaphoresis, mood swings, change in appetite, change in weight, increased thirst Hematologic/Lymphatic:  No anemia, purpura, petechiae. Allergic/Immunologic: no itchy/runny eyes, nasal congestion, recent allergic reactions, rashes  PHYSICAL EXAM: Filed Vitals:   05/27/14 1459  BP: 102/66  Pulse: 66  Resp: 16   General: No acute distress Head:  Normocephalic/atraumatic Eyes:  Fundoscopic exam unremarkable without vessel changes, exudates, hemorrhages or papilledema. Neck: supple, no paraspinal tenderness, full range of motion Heart:  Regular rate and rhythm Lungs:  Clear to auscultation bilaterally Back: No paraspinal tenderness Neurological Exam: alert and oriented to person, place, but not time. Attention span and concentration impaired, remote memory intact, fund of knowledge impaired.  MMSE - Mini Mental State Exam 05/27/2014 10/20/2013 10/20/2013 01/30/2011  Orientation to time 0 0 0 5  Orientation to Place 3 3 3 5   Registration 3 3 3 3   Attention/ Calculation 0 4 4 5   Recall 0 2 2 3   Language- name 2 objects 2 2 2 2     Language- repeat 1 1 1 1   Language- follow 3 step command 3 3 3 3   Language- read & follow direction 1 1 1 1   Write a sentence 1 1 1 1   Copy design 1 1 1 1   Total score 15 21 21 30    Speech fluent and not dysarthric, language intact.  CN II-XII intact. Fundoscopic exam unremarkable without vessel changes, exudates, hemorrhages or papilledema.  Bulk and tone normal, muscle strength 5/5 throughout although limited in the left shoulder due to mild pain.  Gait steady.  IMPRESSION: Alzheimer's disease.  There has been a decline on MMSE, however he is clinically stable  PLAN: We will make no changes Follow up in 9 months  30 minutes spent with patient, over 50% spent discussing diagnosis and management.  Metta Clines,  DO  CC: Bluford Kaufmann, MD

## 2014-05-27 NOTE — Patient Instructions (Signed)
Follow up in 9 months

## 2014-08-19 ENCOUNTER — Encounter: Payer: Self-pay | Admitting: *Deleted

## 2014-09-14 ENCOUNTER — Encounter: Payer: Self-pay | Admitting: Internal Medicine

## 2014-09-14 ENCOUNTER — Ambulatory Visit (INDEPENDENT_AMBULATORY_CARE_PROVIDER_SITE_OTHER): Payer: Medicare Other | Admitting: *Deleted

## 2014-09-14 DIAGNOSIS — R001 Bradycardia, unspecified: Secondary | ICD-10-CM | POA: Diagnosis not present

## 2014-09-14 LAB — CUP PACEART INCLINIC DEVICE CHECK
Battery Impedance: 348 Ohm
Battery Remaining Longevity: 105 mo
Battery Voltage: 2.79 V
Brady Statistic AP VP Percent: 2 %
Brady Statistic AS VP Percent: 3 %
Brady Statistic AS VS Percent: 48 %
Date Time Interrogation Session: 20160727160150
Lead Channel Pacing Threshold Amplitude: 0.75 V
Lead Channel Pacing Threshold Pulse Width: 0.4 ms
Lead Channel Pacing Threshold Pulse Width: 0.4 ms
Lead Channel Sensing Intrinsic Amplitude: 11.2 mV
Lead Channel Setting Pacing Amplitude: 2.5 V
Lead Channel Setting Sensing Sensitivity: 2.8 mV
MDC IDC MSMT LEADCHNL RA IMPEDANCE VALUE: 358 Ohm
MDC IDC MSMT LEADCHNL RA PACING THRESHOLD AMPLITUDE: 0.75 V
MDC IDC MSMT LEADCHNL RA SENSING INTR AMPL: 4 mV
MDC IDC MSMT LEADCHNL RV IMPEDANCE VALUE: 489 Ohm
MDC IDC SET LEADCHNL RA PACING AMPLITUDE: 2 V
MDC IDC SET LEADCHNL RV PACING PULSEWIDTH: 0.4 ms
MDC IDC STAT BRADY AP VS PERCENT: 47 %

## 2014-09-14 NOTE — Progress Notes (Signed)
Pacer check in clinic Estimated battery longevity 8.5 years 3 mode switch episodes--all less than 1 minute No ventricular high rate episodes No changes made ROV in December with JA.

## 2014-11-16 ENCOUNTER — Ambulatory Visit (INDEPENDENT_AMBULATORY_CARE_PROVIDER_SITE_OTHER): Payer: Medicare Other | Admitting: *Deleted

## 2014-11-16 DIAGNOSIS — Z23 Encounter for immunization: Secondary | ICD-10-CM

## 2015-01-23 ENCOUNTER — Encounter: Payer: Self-pay | Admitting: Internal Medicine

## 2015-01-23 ENCOUNTER — Ambulatory Visit (INDEPENDENT_AMBULATORY_CARE_PROVIDER_SITE_OTHER): Payer: Medicare Other | Admitting: Internal Medicine

## 2015-01-23 VITALS — BP 128/48 | HR 48 | Ht 70.0 in | Wt 163.8 lb

## 2015-01-23 DIAGNOSIS — R001 Bradycardia, unspecified: Secondary | ICD-10-CM | POA: Diagnosis not present

## 2015-01-23 DIAGNOSIS — F0280 Dementia in other diseases classified elsewhere without behavioral disturbance: Secondary | ICD-10-CM

## 2015-01-23 DIAGNOSIS — Z950 Presence of cardiac pacemaker: Secondary | ICD-10-CM

## 2015-01-23 DIAGNOSIS — I495 Sick sinus syndrome: Secondary | ICD-10-CM | POA: Diagnosis not present

## 2015-01-23 DIAGNOSIS — G309 Alzheimer's disease, unspecified: Secondary | ICD-10-CM

## 2015-01-23 NOTE — Patient Instructions (Addendum)
Medication Instructions:  Your physician recommends that you continue on your current medications as directed. Please refer to the Current Medication list given to you today.   Labwork: N/A    Testing/Procedures: N/A  Follow-Up: Your physician wants you to follow-up in: 12 months with Dr. Rayann Heman. You will receive a reminder letter in the mail two months in advance. If you don't receive a letter, please call our office to schedule the follow-up appointment.  .    Any Other Special Instructions Will Be Listed Below (If Applicable).     If you need a refill on your cardiac medications before your next appointment, please call your pharmacy.

## 2015-01-23 NOTE — Progress Notes (Signed)
PCP: Nyoka Cowden, MD  [name] is a 81 y.o. male who presents today for routine electrophysiology followup.  Since last being seen in our clinic, the patient reports doing reasonably well.   His wife says he is more dependant on her everyday.  His dementia is not fairly advanced.  Today, he denies symptoms of palpitations, chest pain, shortness of breath,  lower extremity edema, dizziness, presyncope, or syncope.  The patient is otherwise without complaint today.   Past Medical History  Diagnosis Date  . Bladder neck obstruction     HAS FOLEY CATHETER  . Syncope and collapse     due to bradycardia/pauses  . Other and unspecified hyperlipidemia   . Gout, unspecified     GOUT FLARE UP LEFT FOOT ON 02/22/13 - RESOLVED AFTER TAKING 2 DOSES OF INDOCIN  . Bradycardia   . B12 deficiency   . CA in situ bladder   . Hyperlipidemia   . Pacemaker   . GERD (gastroesophageal reflux disease)     RARE - WOULD TAKE TUMS IF NEEDED  . Problems with hearing     WEARS RIGHT HEARING AID  . Dementia     HAS MEMORY PROBLEMS -DEPENDS ON HIS WIFE TO HELP HIM ANSWER QUESTIONS  . Cancer ([place])     PAST HX OF POLYPS IN BLADDER - REMOVED - WIFE STATES NOT SURE IF CANCEROUS   Past Surgical History  Procedure Laterality Date  . Inguinal hernia repair  2.27.2006    left w/mesh Dr Pedro Earls   . Pacemaker placement  07/28/2008    (Medtronic Adapta L)  . Pacemaker removal  08/2008    Due to MSSA   . Pacemaker placement  09/2008    re-implantation   . Hernia repair    . Green light laser turp (transurethral resection of prostate N/A 04/06/2013    Procedure: GREEN LIGHT LASER TURP (TRANSURETHRAL RESECTION OF PROSTATE;  Surgeon: [name], MD;  Location: [facility];  Service: Urology;  Laterality: N/A;  BLADDER BIOPSY         No current outpatient prescriptions on file.   No current facility-administered medications for this visit.    Physical Exam: Filed Vitals:   01/23/15  1544  BP: 128/48  Pulse: 48  Height: 5\' 10"  (1.778 m)  Weight: 163 lb 12.8 oz (74.299 kg)    GEN- The patient is well appearing, alert and oriented x 3 today.   Head- normocephalic, atraumatic Eyes-  Sclera clear, conjunctiva pink Ears- hearing intact Oropharynx- clear Lungs- Clear to ausculation bilaterally, normal work of breathing Chest- pacemaker pocket is well healed Heart- Regular rate and rhythm, no murmurs, rubs or gallops, PMI not laterally displaced GI- soft, NT, ND, + BS Extremities- no clubbing, cyanosis, or edema  Pacemaker interrogation- reviewed in detail today,  See PACEART report  Assessment and Plan:  1. Symptomatic bradycardia/ sick sinus syndrome Normal pacemaker function See Pace Art report No changes today  2. Dementia Stable but advanced I have provided support in discussion with his wife today  Wife does not feel that he can return before 12 months.  She also does not feel that she can do remotes.  Per her request, I will see him again in 1 year.  Thompson Grayer MD, [facility] 01/23/2015 8:21 PM

## 2015-01-25 LAB — CUP PACEART INCLINIC DEVICE CHECK
Battery Impedance: 421 Ohm
Battery Voltage: 2.8 V
Brady Statistic AP VS Percent: 50 %
Brady Statistic AS VP Percent: 7 %
Brady Statistic AS VS Percent: 38 %
Implantable Lead Implant Date: 20100812
Implantable Lead Location: 753859
Implantable Lead Location: 753860
Implantable Lead Model: 5076
Lead Channel Impedance Value: 355 Ohm
Lead Channel Impedance Value: 491 Ohm
Lead Channel Pacing Threshold Amplitude: 0.75 V
Lead Channel Pacing Threshold Pulse Width: 0.4 ms
Lead Channel Sensing Intrinsic Amplitude: 5.6 mV
Lead Channel Sensing Intrinsic Amplitude: 8 mV
Lead Channel Setting Pacing Amplitude: 2 V
MDC IDC LEAD IMPLANT DT: 20100812
MDC IDC MSMT BATTERY REMAINING LONGEVITY: 96 mo
MDC IDC MSMT LEADCHNL RV PACING THRESHOLD AMPLITUDE: 0.75 V
MDC IDC MSMT LEADCHNL RV PACING THRESHOLD PULSEWIDTH: 0.4 ms
MDC IDC SESS DTM: 20161205215846
MDC IDC SET LEADCHNL RV PACING AMPLITUDE: 2.5 V
MDC IDC SET LEADCHNL RV PACING PULSEWIDTH: 0.4 ms
MDC IDC SET LEADCHNL RV SENSING SENSITIVITY: 4 mV
MDC IDC STAT BRADY AP VP PERCENT: 5 %

## 2015-02-10 ENCOUNTER — Ambulatory Visit: Payer: BC Managed Care – PPO | Admitting: Internal Medicine

## 2015-02-24 ENCOUNTER — Ambulatory Visit (INDEPENDENT_AMBULATORY_CARE_PROVIDER_SITE_OTHER): Payer: Medicare Other | Admitting: Neurology

## 2015-02-24 ENCOUNTER — Encounter: Payer: Self-pay | Admitting: Neurology

## 2015-02-24 VITALS — BP 134/70 | HR 97 | Ht 70.0 in | Wt 161.7 lb

## 2015-02-24 DIAGNOSIS — F0280 Dementia in other diseases classified elsewhere without behavioral disturbance: Secondary | ICD-10-CM | POA: Diagnosis not present

## 2015-02-24 DIAGNOSIS — G309 Alzheimer's disease, unspecified: Secondary | ICD-10-CM | POA: Diagnosis not present

## 2015-02-24 NOTE — Patient Instructions (Signed)
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  However, I would prefer that he not be in bed during the day.  If he naps in the easy chair, that is okay.  However, I would at least wake him up every couple of hours to go for a walk around the house (to get blood flowing). Follow up in 9 months.

## 2015-02-24 NOTE — Progress Notes (Signed)
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  He is accompanied by his wife who provides history.    UPDATE: His wife cares for him pretty much around the clock.  She is a bit overwhelmed.  Someone comes to the house one afternoon a week to help out, during which time, Mrs. Schupp is able to go out and spend time on herself, such as seeing a movie. She refuses to have a home nurse or aid come to the house more frequently.  She says she has a younger retired Industrial/product designer available to help out, if needed.  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  He sleeps all of the time.  Even if he is stimulated, he will nod off fairly quickly.  His appetite is good.  He is much more dependent.  His wife helps him get dress.  He is able to still use the toilet himself, but he sometimes has trouble with cleaning up.  On rare occasions, his wife has found stool on the floor.    HISTORY: Mild symptoms were first noticed for several years but became most noticeable in 2010.  At first, he would become disoriented while driving on familiar routes.  He exhibited short-term memory problems such as misplacing objects.  He would go to the grocery store for items and forget what to buy.  He has excessive somnolence.  He wakes up around lunch time, which is why he only eats two meals a day.  He is not depressed.  He does not hallucinate.  He does check to see if the doors are locked at night a couple of times before retiring to bed, but he is not paranoid.  He does not drive.  There has been no significant change in personality other than being less energetic.  He is not more irritable.  He is not abusive or combative.  He is still very sweet.  One time, he asked his daughter to remind him his wife's name.  Another  time, he put a glove in the oven and left it on.  He typically sits around and sleeps all day.  Neuropsychological testing performed on 03/18/11 revealed marked impairment in memory and certain other cognitive domains, suggestive of probable Alzheimer's dementia.  Neurocognitive testing from 09/17/11 revealed impaired immediate memory, story memory, semantic fluency, list recall, list recognition, story recall, figure recall, and clock drawing test, was consistent with dementia.  Re-evaluation from 09/15/12 revealed further decline most notably in contextual verbal memory, visuospatial functioning and aspects of executive functioning.    He lives with his wife.  His oldest son has moved in with them.  Namenda and Aricept were discontinued due to somnolence.     PAST MEDICAL HISTORY: Past Medical History  Diagnosis Date  . Bladder neck obstruction     HAS FOLEY CATHETER  . Syncope and collapse     due to bradycardia/pauses  . Other and unspecified hyperlipidemia   . Gout, unspecified     GOUT FLARE UP LEFT FOOT ON 02/22/13 - RESOLVED AFTER TAKING 2 DOSES OF INDOCIN  . Bradycardia   . B12 deficiency   . CA in situ bladder   . Hyperlipidemia   . Pacemaker   . GERD (gastroesophageal reflux disease)     RARE - WOULD TAKE  TUMS IF NEEDED  . Problems with hearing     WEARS RIGHT HEARING AID  . Dementia     HAS MEMORY PROBLEMS -DEPENDS ON HIS WIFE TO HELP HIM ANSWER QUESTIONS  . Cancer ([place])     PAST HX OF POLYPS IN BLADDER - REMOVED - WIFE STATES NOT SURE IF CANCEROUS    MEDICATIONS: No current outpatient prescriptions on file prior to visit.   No current facility-administered medications on file prior to visit.    ALLERGIES: No Known Allergies  FAMILY HISTORY: Family History  Problem Relation Age of Onset  . Heart disease Other     MI, CHF  . Stroke Other     1st degree relative <50  . Cancer Other     colon  . Heart attack Other   . Heart failure Other     SOCIAL  HISTORY: Social History   Social History  . Marital Status: Married    Spouse Name: N/A  . Number of Children: N/A  . Years of Education: N/A   Occupational History  . RETIRED Pediatrician    Social History Main Topics  . Smoking status: Former Smoker    Quit date: 02/18/1958  . Smokeless tobacco: Never Used  . Alcohol Use: Yes     Comment: small glass of red wine  - RARELY  . Drug Use: No  . Sexual Activity: Not Currently   Other Topics Concern  . Not on file   Social History Narrative   Regular exercise - YES   Highest level of education - MD    REVIEW OF SYSTEMS: Constitutional: No fevers, chills, or sweats, no generalized fatigue, change in appetite Eyes: No visual changes, double vision, eye pain Ear, nose and throat: No hearing loss, ear pain, nasal congestion, sore throat Cardiovascular: No chest pain, palpitations Respiratory:  No shortness of breath at rest or with exertion, wheezes GastrointestinaI: Rarely has fecal incontinence Genitourinary:  No dysuria, urinary retention or frequency Musculoskeletal:  No neck pain, back pain Integumentary: No rash, pruritus, skin lesions Neurological: as above Psychiatric: No depression, insomnia, anxiety Endocrine: No palpitations, fatigue, diaphoresis, mood swings, change in appetite, change in weight, increased thirst Hematologic/Lymphatic:  No anemia, purpura, petechiae. Allergic/Immunologic: no itchy/runny eyes, nasal congestion, recent allergic reactions, rashes  PHYSICAL EXAM: Filed Vitals:   02/24/15 1256  BP: 134/70  Pulse: 97   General: No acute distress.  Patient appears well-groomed.  normal body habitus. Head:  Normocephalic/atraumatic Eyes:  Fundoscopic exam unremarkable without vessel changes, exudates, hemorrhages or papilledema. Neck: supple, no paraspinal tenderness, full range of motion Heart:  Regular rate and rhythm Lungs:  Clear to auscultation bilaterally Back: No paraspinal  tenderness Neurological Exam: alert and oriented to person only (except season and state). Attention span and concentration impaired, remote memory intact, fund of knowledge impaired.  MMSE - Mini Mental State Exam 02/24/2015 05/27/2014 10/20/2013 10/20/2013 01/30/2011  Orientation to time 1 0 0 0 5  Orientation to Place 1 3 3 3 5   Registration 3 3 3 3 3   Attention/ Calculation 0 0 4 4 5   Recall 3 0 2 2 3   Language- name 2 objects 2 2 2 2 2   Language- repeat 0 1 1 1 1   Language- follow 3 step command 1 3 3 3 3   Language- read & follow direction 1 1 1 1 1   Write a sentence 1 1 1 1 1   Copy design 0 1 1 1 1   Total score 13 15 21  21 30   Speech fluent and not dysarthric, language intact.  CN II-XII intact. Fundoscopic exam unremarkable without vessel changes, exudates, hemorrhages or papilledema.  Bulk and tone normal, muscle strength 5/5 throughout although limited in the left shoulder due to mild pain.  Shuffling gait.  IMPRESSION: Alzheimer's dementia, progressed.  PLAN: 1.  Advised that it is okay for him to nap, but he should not remain in bed all day.  If he nods off while sitting in the chair, then he should be awakened to at least take a brief walk around the house (to try and prevent DVT or decubital ulcers). 2.  Recommended that Mrs. Medley reconsider getting more regular help around the house. 3.  Follow up in 9 months.  27 minutes spent face to face with patient, over 50% spent discussing management and prognosis.  Metta Clines, DO  CC:  Bluford Kaufmann, MD

## 2015-02-27 ENCOUNTER — Ambulatory Visit: Payer: BC Managed Care – PPO | Admitting: Neurology

## 2015-02-27 ENCOUNTER — Ambulatory Visit: Payer: BC Managed Care – PPO | Admitting: Internal Medicine

## 2015-03-03 ENCOUNTER — Ambulatory Visit: Payer: BC Managed Care – PPO | Admitting: Internal Medicine

## 2015-03-06 IMAGING — CR DG CHEST 2V
2 series · 2 of 2 positions shown · non-contrast
Comparison: 09/30/2008

CLINICAL DATA: Preop bladder biopsied

EXAM:
CHEST  2 VIEW

[w chest pa]
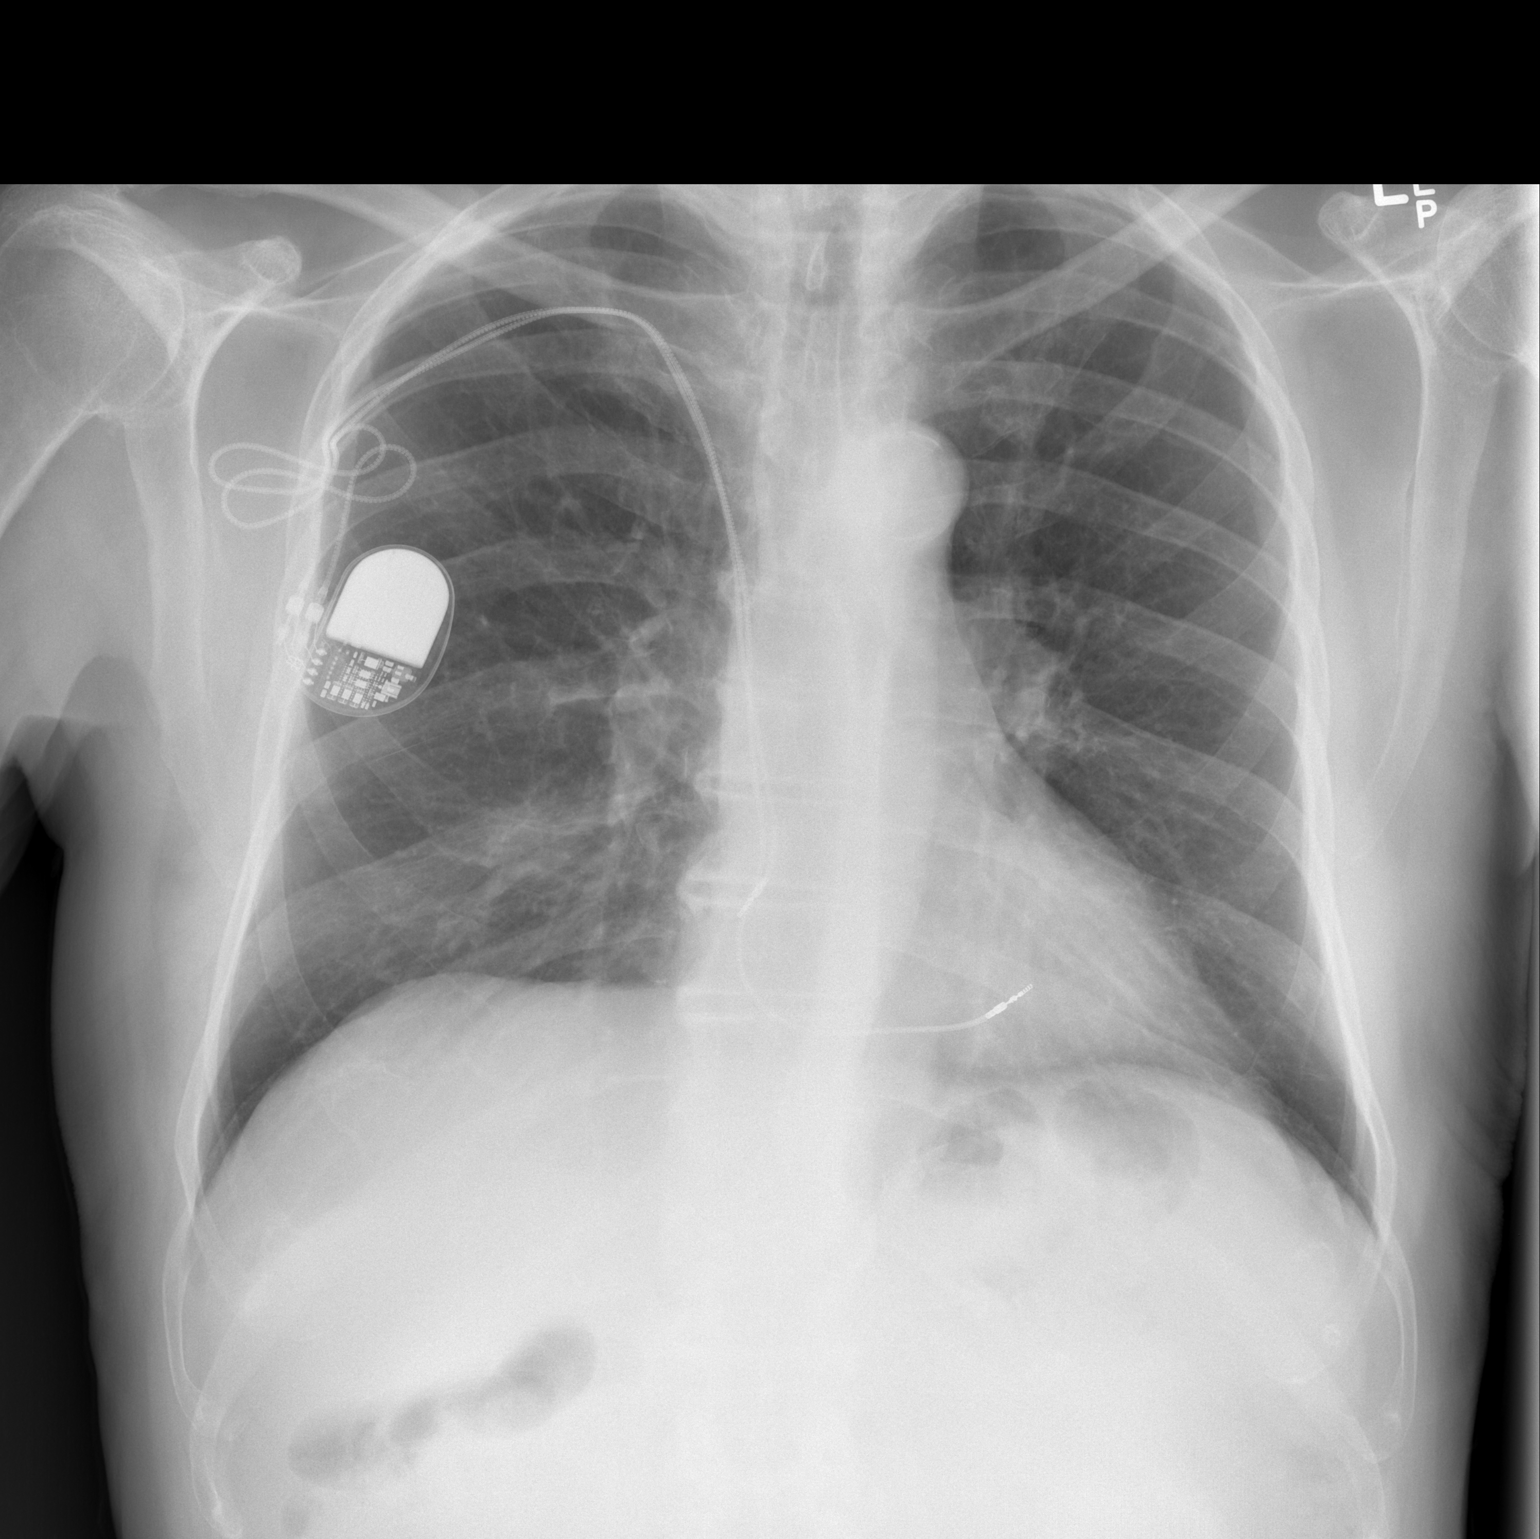

[w chest lat]
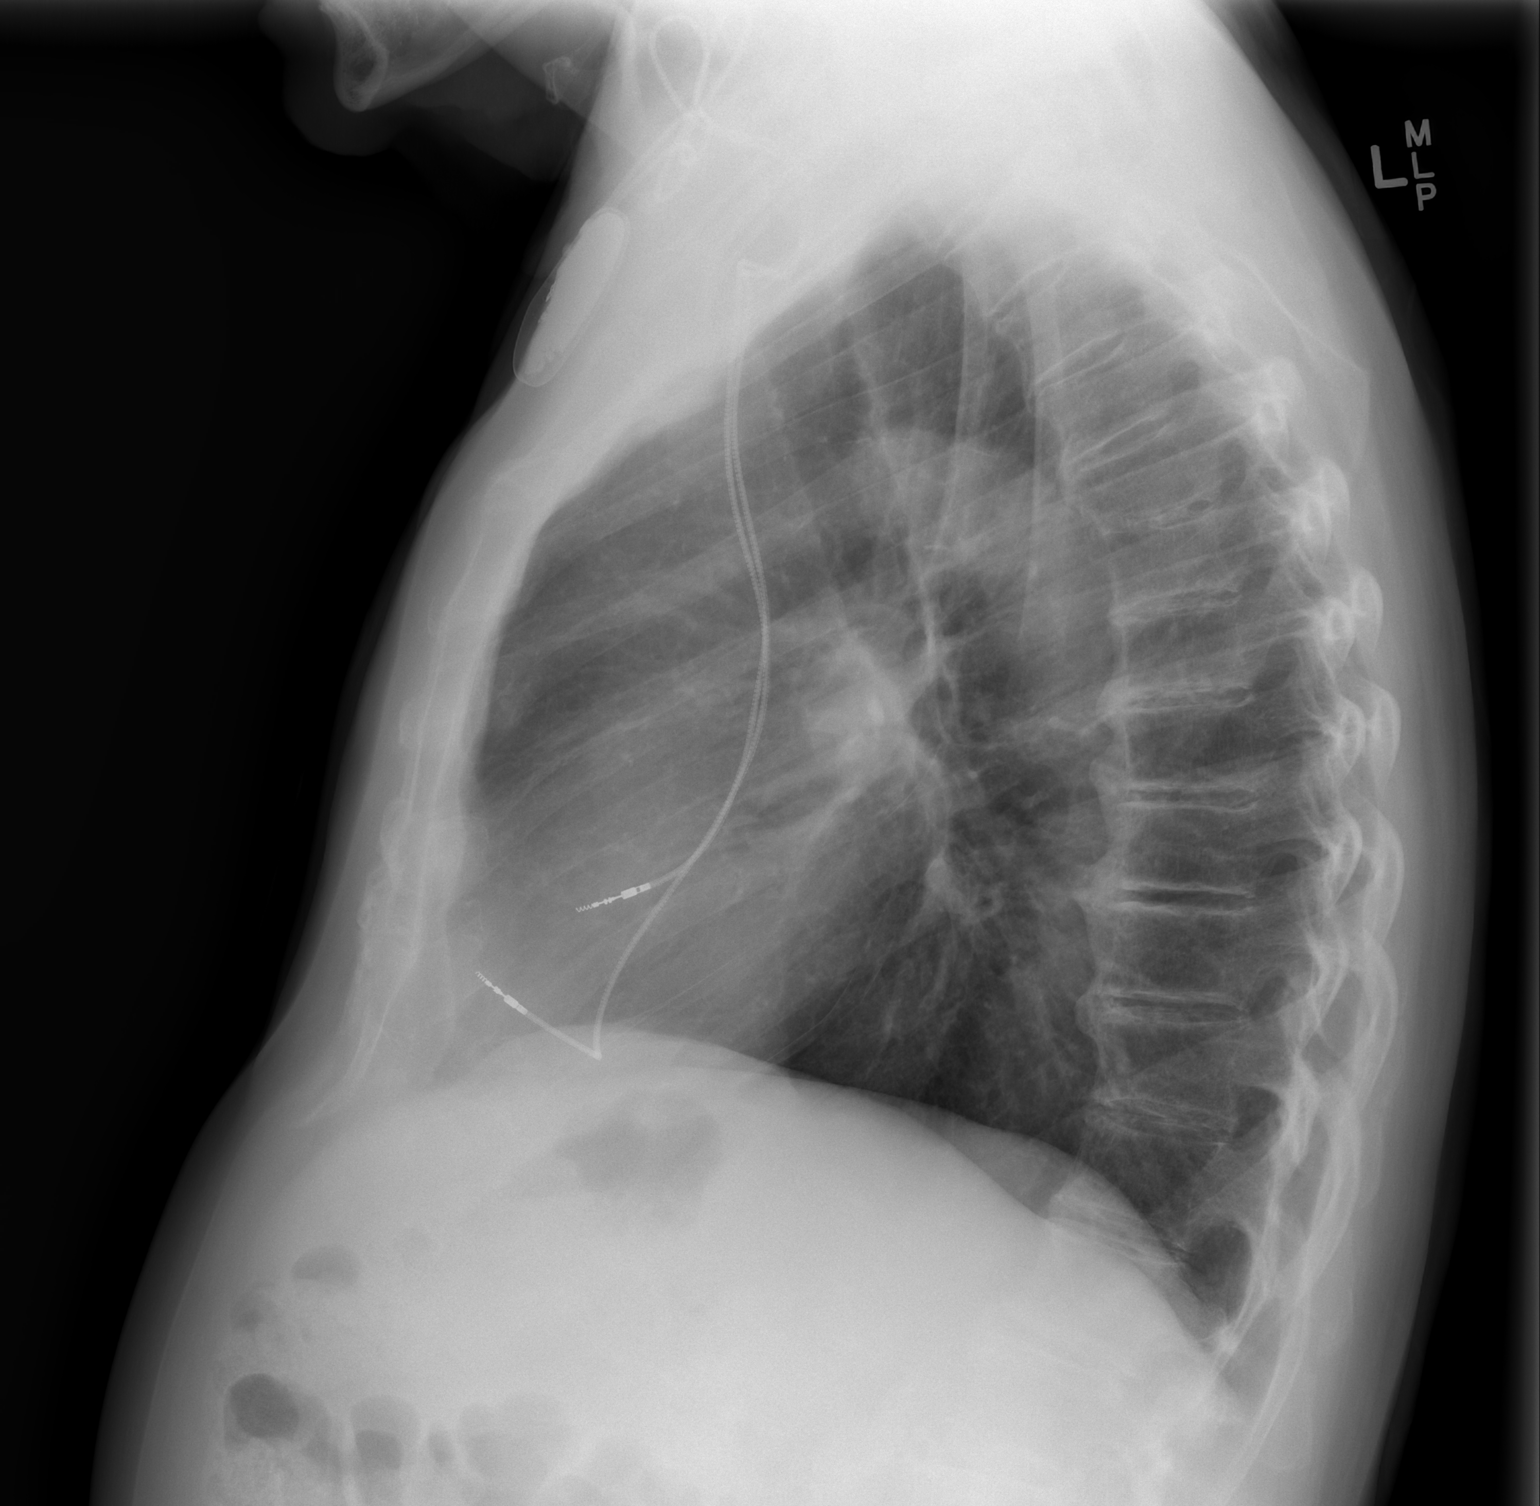

[2 of 2 positions shown; findings below may reference images not displayed]

FINDINGS: Heart size and vascular pattern normal. Lungs clear. Aortic arch
calcified. Right-sided cardiac pacer unchanged. No effusions.
IMPRESSION: No acute findings.

## 2015-03-10 ENCOUNTER — Ambulatory Visit (INDEPENDENT_AMBULATORY_CARE_PROVIDER_SITE_OTHER): Payer: Medicare Other | Admitting: Internal Medicine

## 2015-03-10 ENCOUNTER — Encounter: Payer: Self-pay | Admitting: Internal Medicine

## 2015-03-10 DIAGNOSIS — Z950 Presence of cardiac pacemaker: Secondary | ICD-10-CM | POA: Diagnosis not present

## 2015-03-10 DIAGNOSIS — E538 Deficiency of other specified B group vitamins: Secondary | ICD-10-CM | POA: Diagnosis not present

## 2015-03-10 DIAGNOSIS — G309 Alzheimer's disease, unspecified: Secondary | ICD-10-CM

## 2015-03-10 DIAGNOSIS — F0280 Dementia in other diseases classified elsewhere without behavioral disturbance: Secondary | ICD-10-CM | POA: Diagnosis not present

## 2015-03-10 DIAGNOSIS — I495 Sick sinus syndrome: Secondary | ICD-10-CM

## 2015-03-10 LAB — CBC WITH DIFFERENTIAL/PLATELET
BASOS ABS: 0.1 10*3/uL (ref 0.0–0.1)
BASOS PCT: 0.6 % (ref 0.0–3.0)
EOS PCT: 2.7 % (ref 0.0–5.0)
Eosinophils Absolute: 0.3 10*3/uL (ref 0.0–0.7)
HEMATOCRIT: 48.3 % (ref 39.0–52.0)
HEMOGLOBIN: 15.7 g/dL (ref 13.0–17.0)
Lymphocytes Relative: 26.6 % (ref 12.0–46.0)
Lymphs Abs: 2.7 10*3/uL (ref 0.7–4.0)
MCHC: 32.5 g/dL (ref 30.0–36.0)
MCV: 91.5 fl (ref 78.0–100.0)
MONOS PCT: 6.9 % (ref 3.0–12.0)
Monocytes Absolute: 0.7 10*3/uL (ref 0.1–1.0)
NEUTROS ABS: 6.5 10*3/uL (ref 1.4–7.7)
Neutrophils Relative %: 63.2 % (ref 43.0–77.0)
PLATELETS: 196 10*3/uL (ref 150.0–400.0)
RBC: 5.27 Mil/uL (ref 4.22–5.81)
RDW: 13.7 % (ref 11.5–15.5)
WBC: 10.3 10*3/uL (ref 4.0–10.5)

## 2015-03-10 LAB — COMPREHENSIVE METABOLIC PANEL
ALT: 15 U/L (ref 0–53)
AST: 18 U/L (ref 0–37)
Albumin: 4 g/dL (ref 3.5–5.2)
Alkaline Phosphatase: 59 U/L (ref 39–117)
BUN: 24 mg/dL — ABNORMAL HIGH (ref 6–23)
CO2: 28 mEq/L (ref 19–32)
Calcium: 9.6 mg/dL (ref 8.4–10.5)
Chloride: 103 mEq/L (ref 96–112)
Creatinine, Ser: 1.46 mg/dL (ref 0.40–1.50)
GFR: 48.52 mL/min — AB (ref >60.00)
Glucose, Bld: 117 mg/dL — ABNORMAL HIGH (ref 70–99)
POTASSIUM: 5 meq/L (ref 3.5–5.1)
Sodium: 138 mEq/L (ref 135–145)
TOTAL PROTEIN: 7.6 g/dL (ref 6.0–8.3)
Total Bilirubin: 0.4 mg/dL (ref 0.2–1.2)

## 2015-03-10 LAB — VITAMIN B12: Vitamin B-12: 288 pg/mL (ref 211–911)

## 2015-03-10 LAB — TSH: TSH: 5 u[IU]/mL — ABNORMAL HIGH (ref 0.35–4.50)

## 2015-03-10 MED ORDER — CYANOCOBALAMIN 1000 MCG/ML IJ SOLN
1000.0000 ug | Freq: Once | INTRAMUSCULAR | Status: AC
  Administered 2015-03-10: 1000 ug via INTRAMUSCULAR

## 2015-03-10 NOTE — Progress Notes (Signed)
Pre visit review using our clinic review tool, if applicable. No additional management support is needed unless otherwise documented below in the visit note. 

## 2015-03-10 NOTE — Patient Instructions (Signed)
Limit your sodium (Salt) intake  Return in one year for follow-up  Please call for any new problems or change in status  Cardiology and neurology follow-up as scheduled

## 2015-03-10 NOTE — Addendum Note (Signed)
Addended by: Marian Sorrow on: 03/10/2015 01:33 PM   Modules accepted: Orders

## 2015-03-10 NOTE — Progress Notes (Signed)
Subjective:    Patient ID: [name], male    DOB: [birth year], 82 y.o.   MRN: [id number]  HPI  82 year old patient who is seen today for his annual evaluation.  He has seen neurology and cardiology recently.  Presently off all medications. Doing quite well. He does have a history of B12 deficiency but has not taking any supplements for some time His cardiac status appears stable  Wt Readings from Last 3 Encounters:  03/10/15 164 lb (74.39 kg)  02/24/15 161 lb 11.2 oz (73.347 kg)  01/23/15 163 lb 12.8 oz (74.299 kg)    Past Medical History  Diagnosis Date  . Bladder neck obstruction     HAS FOLEY CATHETER  . Syncope and collapse     due to bradycardia/pauses  . Other and unspecified hyperlipidemia   . Gout, unspecified     GOUT FLARE UP LEFT FOOT ON 02/22/13 - RESOLVED AFTER TAKING 2 DOSES OF INDOCIN  . Bradycardia   . B12 deficiency   . CA in situ bladder   . Hyperlipidemia   . Pacemaker   . GERD (gastroesophageal reflux disease)     RARE - WOULD TAKE TUMS IF NEEDED  . Problems with hearing     WEARS RIGHT HEARING AID  . Dementia     HAS MEMORY PROBLEMS -DEPENDS ON HIS WIFE TO HELP HIM ANSWER QUESTIONS  . Cancer ([place])     PAST HX OF POLYPS IN BLADDER - REMOVED - WIFE STATES NOT SURE IF CANCEROUS    Social History   Social History  . Marital Status: Married    Spouse Name: N/A  . Number of Children: N/A  . Years of Education: N/A   Occupational History  . RETIRED Pediatrician    Social History Main Topics  . Smoking status: Former Smoker    Quit date: 02/18/1958  . Smokeless tobacco: Never Used  . Alcohol Use: Yes     Comment: small glass of red wine  - RARELY  . Drug Use: No  . Sexual Activity: Not Currently   Other Topics Concern  . Not on file   Social History Narrative   Regular exercise - YES   Highest level of education - MD    Past Surgical History  Procedure Laterality Date  . Inguinal hernia repair  2.27.2006    left w/mesh Dr  Pedro Earls   . Pacemaker placement  07/28/2008    (Medtronic Adapta L)  . Pacemaker removal  08/2008    Due to MSSA   . Pacemaker placement  09/2008    re-implantation   . Hernia repair    . Green light laser turp (transurethral resection of prostate N/A 04/06/2013    Procedure: GREEN LIGHT LASER TURP (TRANSURETHRAL RESECTION OF PROSTATE;  Surgeon: [name], MD;  Location: [facility];  Service: Urology;  Laterality: N/A;  BLADDER BIOPSY         Family History  Problem Relation Age of Onset  . Heart disease Other     MI, CHF  . Stroke Other     1st degree relative <50  . Cancer Other     colon  . Heart attack Other   . Heart failure Other     No Known Allergies  No current outpatient prescriptions on file prior to visit.   No current facility-administered medications on file prior to visit.    BP 116/62 mmHg  Pulse 72  Temp(Src) 98.6 F (37 C) (Oral)  Resp 18  Ht 5\' 10"  (1.778 m)  Wt 164 lb (74.39 kg)  BMI 23.53 kg/m2  SpO2 96%     Review of Systems  Constitutional: Negative for fever, chills, appetite change and fatigue.  HENT: Negative for congestion, dental problem, ear pain, hearing loss, sore throat, tinnitus, trouble swallowing and voice change.   Eyes: Negative for pain, discharge and visual disturbance.  Respiratory: Negative for cough, chest tightness, wheezing and stridor.   Cardiovascular: Negative for chest pain, palpitations and leg swelling.  Gastrointestinal: Negative for nausea, vomiting, abdominal pain, diarrhea, constipation, blood in stool and abdominal distention.  Genitourinary: Negative for urgency, hematuria, flank pain, discharge, difficulty urinating and genital sores.  Musculoskeletal: Negative for myalgias, back pain, joint swelling, arthralgias, gait problem and neck stiffness.  Skin: Negative for rash.  Neurological: Negative for dizziness, syncope, speech difficulty, weakness, numbness and headaches.  Hematological:  Negative for adenopathy. Does not bruise/bleed easily.  Psychiatric/Behavioral: Positive for behavioral problems, confusion and decreased concentration. Negative for dysphoric mood. The patient is not nervous/anxious.        Objective:   Physical Exam  Constitutional: He is oriented to person, place, and time. He appears well-developed.  Blood pressure low normal  HENT:  Head: Normocephalic.  Right Ear: External ear normal.  Left Ear: External ear normal.  Eyes: Conjunctivae and EOM are normal.  Neck: Normal range of motion.  Cardiovascular: Normal rate and normal heart sounds.   Slight irregularity  Pulmonary/Chest: Breath sounds normal.  Abdominal: Bowel sounds are normal.  Musculoskeletal: Normal range of motion. He exhibits no edema or tenderness.  Neurological: He is alert and oriented to person, place, and time.  Psychiatric: He has a normal mood and affect. His behavior is normal.          Assessment & Plan:    Alzheimer's History B12 deficiency.  We'll check a B12 level and give a 1 mg IM supplement History of sick sinus syndrome Dyslipidemia History of gout  We'll check laboratory update including B12 level Recheck one year or as needed

## 2015-03-29 ENCOUNTER — Encounter: Payer: Self-pay | Admitting: Neurology

## 2015-05-08 ENCOUNTER — Telehealth: Payer: Self-pay | Admitting: Internal Medicine

## 2015-05-08 NOTE — Telephone Encounter (Signed)
[facility] - Client [facility]  Patient Name: [name]  DOB: [birth year]    Initial Comment Caller states her husband has Alzheimer's. The PT just returned from a trip to the dentist. The PT is very dizzy.    Nurse Assessment  Nurse: Arnetha Courser, RN, Susie Date/Time (Eastern Time): 05/08/2015 3:20:43 PM  Confirm and document reason for call. If symptomatic, describe symptoms. You must click the next button to save text entered. ---Caller states her husband has Alzheimer's. He just returned from a trip to the dentist and had a period of dizziness - but has subsided;  Has the patient traveled out of the country within the last 30 days? ---Not Applicable  Does the patient have any new or worsening symptoms? ---Yes  Will a triage be completed? ---Yes  Related visit to physician within the last 2 weeks? ---N/A  Does the PT have any chronic conditions? (i.e. diabetes, asthma, etc.) ---Yes  List chronic conditions. ---alzheimers;  Is this a behavioral health or substance abuse call? ---No     Guidelines    Guideline Title Affirmed Question Affirmed Notes  Dizziness - Lightheadedness SEVERE dizziness (e.g., unable to stand, requires support to walk, feels like passing out now)    Final Disposition User   Go to ED Now (or PCP triage) Wisdom, RN, Susie    Comments  during triage he had another episode of dizziness and inability to stand; wife instructed to call 911 for assistance, she declined and stated she would call her son's for assistance and that she was not taking him to the ED but would take to a UC; caller asked for appointment with the office, none available; caller is waiting for her son's to make a decision;   Referrals  GO TO FACILITY UNDECIDED   Disagree/Comply: Disagree  Disagree/Comply Reason: Wait and see

## 2015-05-08 NOTE — Telephone Encounter (Signed)
Spoke to pt's wife, asked her how pt was doing? Mrs [name] said she finally got a neighbor to help her get pt in the house. She said she thinks pt is dehydrated and blood pressure is low. She has been giving him fluids since she got him in the house. Asked her if she checked his blood pressure? She said she tried but keeps getting error. Told her if she thinks pt is dehydrated and blood pressure is low she needs to take him to Urgent care or ED. Mrs [name] said she is not making any decision till have children come by after work and she has some help and she said she was told if she takes him to urgent care they will just make her take him to ED and she does not want to. Told her okay but if you do not take him to Urgent care or ED tonight and need appt tomorrow please call in the morning. Mrs [name] verbalized understanding.

## 2015-05-11 ENCOUNTER — Telehealth: Payer: Self-pay | Admitting: Internal Medicine

## 2015-05-11 NOTE — Telephone Encounter (Signed)
Returned call to patient's wife and she says he has a "shuffuling gait" and is dizzy. He's not in pain that he can tell her.  His BP and HR are good.  She says he is just failing.  He is not on any medications.  At first I suggested he call his PCP, she said she did this and was told to take him to the ER.  He is seen by Neuro and so I have suggested she call them to see if they have any suggestions for her.  She was very appreciative of my return call and will call me back if needed.

## 2015-05-11 NOTE — Telephone Encounter (Signed)
New Message  Pt wife requested to speak w/ RN- pt c/o of dizziness, fatigue- since Monday 3/20 (after dental appt). Please call back and discuss.

## 2015-05-12 ENCOUNTER — Telehealth: Payer: Self-pay

## 2015-05-12 NOTE — Telephone Encounter (Signed)
Attempted to reach pt and/or wife. Line just rang and rang. Will attempt again.

## 2015-05-12 NOTE — Telephone Encounter (Signed)
Wife aware. Does not plan to take pt to E.R.. Encouraged wife to seek care, at [facility] or PCP, if ER isn't an option. Wife states she thinks he's okay.

## 2015-05-12 NOTE — Telephone Encounter (Signed)
Received fax from phone service. States that wife called yesterday. On Monday pt went to dentist. They used novocain for local but nothing to sedate him. His is very weak, is sleeping 2/3s of his life After dental procedure it took 2 people to get him in and out of car. PCP office advised her to take him to ER. They left before being seen at E.R. He has no complaints but is still very dizzy, weak, and shuffling his feet. Definite change after the dentist. Only MD seen in last 2 weeks. Please advise.

## 2015-05-12 NOTE — Telephone Encounter (Signed)
Needs to go to ED as recommended by PCP or contact PCP again.

## 2015-08-18 ENCOUNTER — Encounter: Payer: Self-pay | Admitting: Family Medicine

## 2015-08-18 ENCOUNTER — Ambulatory Visit (INDEPENDENT_AMBULATORY_CARE_PROVIDER_SITE_OTHER): Payer: Medicare Other | Admitting: Family Medicine

## 2015-08-18 ENCOUNTER — Telehealth: Payer: Self-pay | Admitting: Internal Medicine

## 2015-08-18 VITALS — BP 112/70 | HR 73 | Temp 98.4°F | Ht 70.0 in | Wt 164.9 lb

## 2015-08-18 DIAGNOSIS — K649 Unspecified hemorrhoids: Secondary | ICD-10-CM

## 2015-08-18 LAB — CBC
HEMATOCRIT: 43.7 % (ref 39.0–52.0)
HEMOGLOBIN: 14.8 g/dL (ref 13.0–17.0)
MCHC: 33.9 g/dL (ref 30.0–36.0)
MCV: 88.7 fl (ref 78.0–100.0)
PLATELETS: 206 10*3/uL (ref 150.0–400.0)
RBC: 4.92 Mil/uL (ref 4.22–5.81)
RDW: 14.1 % (ref 11.5–15.5)
WBC: 9 10*3/uL (ref 4.0–10.5)

## 2015-08-18 NOTE — Patient Instructions (Signed)
BEFORE YOU LEAVE: -labs -schedule follow up in 2-4 weeks  Sitz baths as possible  Steroid hemorrhoid cream per instructions  Follow up sooner if worsening, large amounts of bleeding or other concerns  Hemorrhoids Hemorrhoids are swollen veins around the rectum or anus. There are two types of hemorrhoids:   Internal hemorrhoids. These occur in the veins just inside the rectum. They may poke through to the outside and become irritated and painful.  External hemorrhoids. These occur in the veins outside the anus and can be felt as a painful swelling or hard lump near the anus. CAUSES  Pregnancy.   Obesity.   Constipation or diarrhea.   Straining to have a bowel movement.   Sitting for long periods on the toilet.  Heavy lifting or other activity that caused you to strain.  Anal intercourse. SYMPTOMS   Pain.   Anal itching or irritation.   Rectal bleeding.   Fecal leakage.   Anal swelling.   One or more lumps around the anus.  DIAGNOSIS  Your caregiver may be able to diagnose hemorrhoids by visual examination. Other examinations or tests that may be performed include:   Examination of the rectal area with a gloved hand (digital rectal exam).   Examination of anal canal using a small tube (scope).   A blood test if you have lost a significant amount of blood.  A test to look inside the colon (sigmoidoscopy or colonoscopy). TREATMENT Most hemorrhoids can be treated at home. However, if symptoms do not seem to be getting better or if you have a lot of rectal bleeding, your caregiver may perform a procedure to help make the hemorrhoids get smaller or remove them completely. Possible treatments include:   Placing a rubber band at the base of the hemorrhoid to cut off the circulation (rubber band ligation).   Injecting a chemical to shrink the hemorrhoid (sclerotherapy).   Using a tool to burn the hemorrhoid (infrared light therapy).   Surgically  removing the hemorrhoid (hemorrhoidectomy).   Stapling the hemorrhoid to block blood flow to the tissue (hemorrhoid stapling).  HOME CARE INSTRUCTIONS   Eat foods with fiber, such as whole grains, beans, nuts, fruits, and vegetables. Ask your doctor about taking products with added fiber in them (fibersupplements).  Increase fluid intake. Drink enough water and fluids to keep your urine clear or pale yellow.   Exercise regularly.   Go to the bathroom when you have the urge to have a bowel movement. Do not wait.   Avoid straining to have bowel movements.   Keep the anal area dry and clean. Use wet toilet paper or moist towelettes after a bowel movement.   Medicated creams and suppositories may be used or applied as directed.   Only take over-the-counter or prescription medicines as directed by your caregiver.   Take warm sitz baths for 15-20 minutes, 3-4 times a day to ease pain and discomfort.   Place ice packs on the hemorrhoids if they are tender and swollen. Using ice packs between sitz baths may be helpful.   Put ice in a plastic bag.   Place a towel between your skin and the bag.   Leave the ice on for 15-20 minutes, 3-4 times a day.   Do not use a donut-shaped pillow or sit on the toilet for long periods. This increases blood pooling and pain.  SEEK MEDICAL CARE IF:  You have increasing pain and swelling that is not controlled by treatment or medicine.  You  have uncontrolled bleeding.  You have difficulty or you are unable to have a bowel movement.  You have pain or inflammation outside the area of the hemorrhoids. MAKE SURE YOU:  Understand these instructions.  Will watch your condition.  Will get help right away if you are not doing well or get worse.   This information is not intended to replace advice given to you by your health care provider. Make sure you discuss any questions you have with your health care provider.   Document Released:  02/02/2000 Document Revised: 01/22/2012 Document Reviewed: 12/10/2011 Elsevier Interactive Patient Education [employer].

## 2015-08-18 NOTE — Telephone Encounter (Signed)
FYI

## 2015-08-18 NOTE — Progress Notes (Signed)
Pre visit review using our clinic review tool, if applicable. No additional management support is needed unless otherwise documented below in the visit note. 

## 2015-08-18 NOTE — Progress Notes (Signed)
HPI:  [name] is a pleasant 82 year old with significant dementia here for an acute visit for bright red blood per rectum. His wife reports he has had several episodes of bright red blood per rectum over the course of the last month. She is unsure if he has hemorrhoids as he does not complain. He does not remember this. She says that she saw several drops of blood in his underwear and blood on the toilet paper yesterday after he had a bowel movement. She has seen this on several other occasions. She does not always check his stools, so she is unaware of whether or not it has happened in larger amounts on other occasions. He has not complained of anything, and she thinks that his energy level is at baseline. No known diarrhea or constipation. She reports he wipes a lot, filled the toilet with toilet paper. Last labs on review of chart in January with normal CBC. Patient poor historian, so review of systems not possible, except per wife.  ROS: See pertinent positives and negatives per HPI.  Past Medical History  Diagnosis Date  . Bladder neck obstruction     HAS FOLEY CATHETER  . Syncope and collapse     due to bradycardia/pauses  . Other and unspecified hyperlipidemia   . Gout, unspecified     GOUT FLARE UP LEFT FOOT ON 02/22/13 - RESOLVED AFTER TAKING 2 DOSES OF INDOCIN  . Bradycardia   . B12 deficiency   . CA in situ bladder   . Hyperlipidemia   . Pacemaker   . GERD (gastroesophageal reflux disease)     RARE - WOULD TAKE TUMS IF NEEDED  . Problems with hearing     WEARS RIGHT HEARING AID  . Dementia     HAS MEMORY PROBLEMS -DEPENDS ON HIS WIFE TO HELP HIM ANSWER QUESTIONS  . Cancer ([place])     PAST HX OF POLYPS IN BLADDER - REMOVED - WIFE STATES NOT SURE IF CANCEROUS    Past Surgical History  Procedure Laterality Date  . Inguinal hernia repair  2.27.2006    left w/mesh Dr Pedro Earls   . Pacemaker placement  07/28/2008    (Medtronic Adapta L)  . Pacemaker removal  08/2008     Due to MSSA   . Pacemaker placement  09/2008    re-implantation   . Hernia repair    . Green light laser turp (transurethral resection of prostate N/A 04/06/2013    Procedure: GREEN LIGHT LASER TURP (TRANSURETHRAL RESECTION OF PROSTATE;  Surgeon: [name], MD;  Location: [facility];  Service: Urology;  Laterality: N/A;  BLADDER BIOPSY         Family History  Problem Relation Age of Onset  . Heart disease Other     MI, CHF  . Stroke Other     1st degree relative <50  . Cancer Other     colon  . Heart attack Other   . Heart failure Other     Social History   Social History  . Marital Status: Married    Spouse Name: N/A  . Number of Children: N/A  . Years of Education: N/A   Occupational History  . RETIRED Pediatrician    Social History Main Topics  . Smoking status: Former Smoker    Quit date: 02/18/1958  . Smokeless tobacco: Never Used  . Alcohol Use: Yes     Comment: small glass of red wine  - RARELY  . Drug Use:  No  . Sexual Activity: Not Currently   Other Topics Concern  . None   Social History Narrative   Regular exercise - YES   Highest level of education - MD    No current outpatient prescriptions on file.  EXAM:  Filed Vitals:   08/18/15 1301  BP: 112/70  Pulse: 73  Temp: 98.4 F (36.9 C)    Body mass index is 23.66 kg/(m^2).  GENERAL: vitals reviewed and listed above, alert, oriented, appears well hydrated and in no acute distress  HEENT: atraumatic, conjunttiva clear, no obvious abnormalities on inspection of external nose and ears  NECK: no obvious masses on inspection  LUNGS: clear to auscultation bilaterally, no wheezes, rales or rhonchi, good air movement  CV: HRRR, no peripheral edema  RECTAL: non thrombosed hemorrhoids; no active bleeding on exam  MS: moves all extremities without noticeable abnormality  PSYCH: pleasant and cooperative, no obvious depression or anxiety  ASSESSMENT AND PLAN:  Discussed the  following assessment and plan:  Hemorrhoids, unspecified hemorrhoid type - Plan: CBC (no diff)  -hemorrhoids are the likely source of bleeding, wife concerned may have been a lot of bleeding as she does not always get a chance to examine - will check cbc -discussed bowel regimen -sitz bath, protosol - she has cream at home and wants to use, not expired -follow up in 2-4 weeks; if not improving may need GI referral as wife very distraught about this -they asked about transferring care to me as they are frustrated with phone lines and recs to go to emergency room whenever they call - let them know ok with transfer, apologized for phone issue but explained don't think this would be different with changing PCP - they opted to have him stay with current PCP -only have just met [name], but seems his dementia may be pretty severe and Mrs. Futrell seems overwhelmed. Will try to call to check on her tomorrow. May need further discussion with PCP in term of options for respite care at home/ [place]/Palliative consult -Patient advised to return or notify a doctor immediately if symptoms worsen or persist or new concerns arise.  Patient Instructions  BEFORE YOU LEAVE: -labs -schedule follow up in 2-4 weeks  Sitz baths as possible  Steroid hemorrhoid cream per instructions  Follow up sooner if worsening, large amounts of bleeding or other concerns  Hemorrhoids Hemorrhoids are swollen veins around the rectum or anus. There are two types of hemorrhoids:   Internal hemorrhoids. These occur in the veins just inside the rectum. They may poke through to the outside and become irritated and painful.  External hemorrhoids. These occur in the veins outside the anus and can be felt as a painful swelling or hard lump near the anus. CAUSES  Pregnancy.   Obesity.   Constipation or diarrhea.   Straining to have a bowel movement.   Sitting for long periods on the toilet.  Heavy lifting or other  activity that caused you to strain.  Anal intercourse. SYMPTOMS   Pain.   Anal itching or irritation.   Rectal bleeding.   Fecal leakage.   Anal swelling.   One or more lumps around the anus.  DIAGNOSIS  Your caregiver may be able to diagnose hemorrhoids by visual examination. Other examinations or tests that may be performed include:   Examination of the rectal area with a gloved hand (digital rectal exam).   Examination of anal canal using a small tube (scope).   A blood test  if you have lost a significant amount of blood.  A test to look inside the colon (sigmoidoscopy or colonoscopy). TREATMENT Most hemorrhoids can be treated at home. However, if symptoms do not seem to be getting better or if you have a lot of rectal bleeding, your caregiver may perform a procedure to help make the hemorrhoids get smaller or remove them completely. Possible treatments include:   Placing a rubber band at the base of the hemorrhoid to cut off the circulation (rubber band ligation).   Injecting a chemical to shrink the hemorrhoid (sclerotherapy).   Using a tool to burn the hemorrhoid (infrared light therapy).   Surgically removing the hemorrhoid (hemorrhoidectomy).   Stapling the hemorrhoid to block blood flow to the tissue (hemorrhoid stapling).  HOME CARE INSTRUCTIONS   Eat foods with fiber, such as whole grains, beans, nuts, fruits, and vegetables. Ask your doctor about taking products with added fiber in them (fibersupplements).  Increase fluid intake. Drink enough water and fluids to keep your urine clear or pale yellow.   Exercise regularly.   Go to the bathroom when you have the urge to have a bowel movement. Do not wait.   Avoid straining to have bowel movements.   Keep the anal area dry and clean. Use wet toilet paper or moist towelettes after a bowel movement.   Medicated creams and suppositories may be used or applied as directed.   Only take  over-the-counter or prescription medicines as directed by your caregiver.   Take warm sitz baths for 15-20 minutes, 3-4 times a day to ease pain and discomfort.   Place ice packs on the hemorrhoids if they are tender and swollen. Using ice packs between sitz baths may be helpful.   Put ice in a plastic bag.   Place a towel between your skin and the bag.   Leave the ice on for 15-20 minutes, 3-4 times a day.   Do not use a donut-shaped pillow or sit on the toilet for long periods. This increases blood pooling and pain.  SEEK MEDICAL CARE IF:  You have increasing pain and swelling that is not controlled by treatment or medicine.  You have uncontrolled bleeding.  You have difficulty or you are unable to have a bowel movement.  You have pain or inflammation outside the area of the hemorrhoids. MAKE SURE YOU:  Understand these instructions.  Will watch your condition.  Will get help right away if you are not doing well or get worse.   This information is not intended to replace advice given to you by your health care provider. Make sure you discuss any questions you have with your health care provider.   Document Released: 02/02/2000 Document Revised: 01/22/2012 Document Reviewed: 12/10/2011 Elsevier Interactive Patient Education 2016 [place]., DO

## 2015-08-18 NOTE — Telephone Encounter (Signed)
Patient Name: [name]  DOB: 20-Sep-1925    Initial Comment caller states husband has rectal bleeding   Nurse Assessment  Nurse: Verlin Fester RN, Stanton Kidney Date/Time Eilene Ghazi Time): 08/18/2015 9:06:25 AM  Confirm and document reason for call. If symptomatic, describe symptoms. You must click the next button to save text entered. ---Wife states her husband has had rectal bleeding for a while. Yesterday there was a lot of blood and blood dripped on the mat. He has some really bad hemorrhoids.  Has the patient traveled out of the country within the last 30 days? ---No  Does the patient have any new or worsening symptoms? ---Yes  Will a triage be completed? ---Yes  Related visit to physician within the last 2 weeks? ---No  Does the PT have any chronic conditions? (i.e. diabetes, asthma, etc.) ---Yes  List chronic conditions. ---"Alzheimers"  Is this a behavioral health or substance abuse call? ---No     Guidelines    Guideline Title Affirmed Question Affirmed Notes  Rectal Bleeding [1] MODERATE rectal bleeding (small blood clots, passing blood without stool, or toilet water turns red) AND [2] more than once a day    Final Disposition User   Go to ED Now Verlin Fester, RN, Stanton Kidney    Comments  After triage caller refused to take him to ED. States he is fine right now and sleeping. States she wants to have him seen in the office.   Referrals  REFERRED TO PCP OFFICE   Disagree/Comply: Disagree  Disagree/Comply Reason: Disagree with instructions   Appointment scheduled with Dr. Maudie Mercury for 1pm today

## 2015-09-11 ENCOUNTER — Ambulatory Visit: Payer: Medicare Other | Admitting: Internal Medicine

## 2015-09-12 ENCOUNTER — Encounter: Payer: Self-pay | Admitting: Internal Medicine

## 2015-09-12 ENCOUNTER — Ambulatory Visit (INDEPENDENT_AMBULATORY_CARE_PROVIDER_SITE_OTHER): Payer: Medicare Other | Admitting: Internal Medicine

## 2015-09-12 VITALS — BP 120/70 | HR 70 | Temp 97.9°F | Ht 70.0 in | Wt 164.0 lb

## 2015-09-12 DIAGNOSIS — Z950 Presence of cardiac pacemaker: Secondary | ICD-10-CM | POA: Diagnosis not present

## 2015-09-12 DIAGNOSIS — K648 Other hemorrhoids: Secondary | ICD-10-CM | POA: Diagnosis not present

## 2015-09-12 DIAGNOSIS — F0280 Dementia in other diseases classified elsewhere without behavioral disturbance: Secondary | ICD-10-CM

## 2015-09-12 DIAGNOSIS — E785 Hyperlipidemia, unspecified: Secondary | ICD-10-CM | POA: Diagnosis not present

## 2015-09-12 DIAGNOSIS — G309 Alzheimer's disease, unspecified: Secondary | ICD-10-CM

## 2015-09-12 DIAGNOSIS — K644 Residual hemorrhoidal skin tags: Secondary | ICD-10-CM

## 2015-09-12 NOTE — Progress Notes (Signed)
Subjective:    Patient ID: [name], male    DOB: 1925-04-23, 82 y.o.   MRN: [id number]  HPI 82 year old patient who has a history of Alzheimer's dementia.  He also has a history of B12 deficiency.  He is status post pacemaker insertion for symptomatic bradycardia. He was seen last month with bright red rectal bleeding thought secondary to external hemorrhoids.  He has been treated with topical therapy and the bleeding seems to have resolved.  His wife has noted no further bleeding on the tissue paper undergarments or bed linens.  Past Medical History:  Diagnosis Date  . B12 deficiency   . Bladder neck obstruction    HAS FOLEY CATHETER  . Bradycardia   . CA in situ bladder   . Cancer ([place])    PAST HX OF POLYPS IN BLADDER - REMOVED - WIFE STATES NOT SURE IF CANCEROUS  . Dementia    HAS MEMORY PROBLEMS -DEPENDS ON HIS WIFE TO HELP HIM ANSWER QUESTIONS  . GERD (gastroesophageal reflux disease)    RARE - WOULD TAKE TUMS IF NEEDED  . Gout, unspecified    GOUT FLARE UP LEFT FOOT ON 02/22/13 - RESOLVED AFTER TAKING 2 DOSES OF INDOCIN  . Hyperlipidemia   . Other and unspecified hyperlipidemia   . Pacemaker   . Problems with hearing    WEARS RIGHT HEARING AID  . Syncope and collapse    due to bradycardia/pauses     Social History   Social History  . Marital status: Married    Spouse name: N/A  . Number of children: N/A  . Years of education: N/A   Occupational History  . RETIRED Pediatrician Retired   Social History Main Topics  . Smoking status: Former Smoker    Quit date: 02/18/1958  . Smokeless tobacco: Never Used  . Alcohol use Yes     Comment: small glass of red wine  - RARELY  . Drug use: No  . Sexual activity: Not Currently   Other Topics Concern  . Not on file   Social History Narrative   Regular exercise - YES   Highest level of education - MD    Past Surgical History:  Procedure Laterality Date  . GREEN LIGHT LASER TURP (TRANSURETHRAL RESECTION OF  PROSTATE N/A 04/06/2013   Procedure: GREEN LIGHT LASER TURP (TRANSURETHRAL RESECTION OF PROSTATE;  Surgeon: [name], MD;  Location: [facility];  Service: Urology;  Laterality: N/A;  BLADDER BIOPSY       . HERNIA REPAIR    . INGUINAL HERNIA REPAIR  2.27.2006   left w/mesh Dr Pedro Earls   . PACEMAKER PLACEMENT  07/28/2008   (Medtronic Adapta L)  . PACEMAKER PLACEMENT  09/2008   re-implantation   . PACEMAKER REMOVAL  08/2008   Due to MSSA     Family History  Problem Relation Age of Onset  . Heart disease Other     MI, CHF  . Stroke Other     1st degree relative <50  . Cancer Other     colon  . Heart attack Other   . Heart failure Other     No Known Allergies  No current outpatient prescriptions on file prior to visit.   No current facility-administered medications on file prior to visit.     BP 120/70   Pulse 70   Temp 97.9 F (36.6 C) (Oral)   Ht 5\' 10"  (1.778 m)   Wt 164 lb (74.4 kg)  SpO2 96%   BMI 23.53 kg/m      Review of Systems  Gastrointestinal: Positive for blood in stool. Negative for rectal pain.  Psychiatric/Behavioral: Positive for behavioral problems, confusion and decreased concentration.       Objective:   Physical Exam  Constitutional: He is oriented to person, place, and time. He appears well-developed.  HENT:  Head: Normocephalic.  Right Ear: External ear normal.  Left Ear: External ear normal.  Eyes: Conjunctivae and EOM are normal.  Neck: Normal range of motion.  Cardiovascular: Normal rate and normal heart sounds.   Pulmonary/Chest: Breath sounds normal.  Abdominal: Bowel sounds are normal.  Musculoskeletal: Normal range of motion. He exhibits no edema or tenderness.  Neurological: He is alert and oriented to person, place, and time.  Psychiatric: He has a normal mood and affect. His behavior is normal.  Moderate dementia          Assessment & Plan:   History of external hemorrhoids.  Bleeding has  resolved.  Will continue observation.  CBC reviewed and was normal Alzheimer's dementia.  Patient's wife does have a 78 year old son that lives in the household.  Primary caregiver encouraged to attempt to find time for further personal time  CPX 6 months as scheduled  Nyoka Cowden, MD

## 2015-09-12 NOTE — Patient Instructions (Addendum)
Vitamin B12 1 mg daily  (1000 g)  Return in 6 months for follow-up  Call or return to clinic prn if these symptoms worsen or fail to improve as anticipated.

## 2015-10-13 ENCOUNTER — Encounter: Payer: Self-pay | Admitting: Family Medicine

## 2015-10-13 ENCOUNTER — Ambulatory Visit (INDEPENDENT_AMBULATORY_CARE_PROVIDER_SITE_OTHER): Payer: Medicare Other | Admitting: Family Medicine

## 2015-10-13 VITALS — BP 100/60 | HR 87 | Temp 98.9°F | Ht 70.0 in | Wt 165.9 lb

## 2015-10-13 DIAGNOSIS — B029 Zoster without complications: Secondary | ICD-10-CM

## 2015-10-13 DIAGNOSIS — L03115 Cellulitis of right lower limb: Secondary | ICD-10-CM

## 2015-10-13 DIAGNOSIS — T148 Other injury of unspecified body region: Secondary | ICD-10-CM

## 2015-10-13 DIAGNOSIS — T148XXA Other injury of unspecified body region, initial encounter: Secondary | ICD-10-CM

## 2015-10-13 MED ORDER — CEPHALEXIN 500 MG PO CAPS: 500.0000 mg | ORAL_CAPSULE | Freq: Two times a day (BID) | ORAL | 0 refills | Status: DC

## 2015-10-13 MED ORDER — MUPIROCIN 2 % EX OINT: 1.0000 "application " | TOPICAL_OINTMENT | Freq: Two times a day (BID) | CUTANEOUS | 0 refills | Status: DC

## 2015-10-13 NOTE — Progress Notes (Signed)
HPI:  Acute visit for:  Wound on leg: -R shin -fell about 1 week ago and scraped shin, no other injury -reports developed redness and oozing, treating with OTC abx ointment and improved a little -denies fevers, malaise, spreading redness  Skin Rash L flank: -started about 1 month ago with lesion on back, then wrapped around to front -now just itchy  ROS: See pertinent positives and negatives per HPI.  Past Medical History:  Diagnosis Date  . B12 deficiency   . Bladder neck obstruction    HAS FOLEY CATHETER  . Bradycardia   . CA in situ bladder   . Cancer ([place])    PAST HX OF POLYPS IN BLADDER - REMOVED - WIFE STATES NOT SURE IF CANCEROUS  . Dementia    HAS MEMORY PROBLEMS -DEPENDS ON HIS WIFE TO HELP HIM ANSWER QUESTIONS  . GERD (gastroesophageal reflux disease)    RARE - WOULD TAKE TUMS IF NEEDED  . Gout, unspecified    GOUT FLARE UP LEFT FOOT ON 02/22/13 - RESOLVED AFTER TAKING 2 DOSES OF INDOCIN  . Hyperlipidemia   . Other and unspecified hyperlipidemia   . Pacemaker   . Problems with hearing    WEARS RIGHT HEARING AID  . Syncope and collapse    due to bradycardia/pauses    Past Surgical History:  Procedure Laterality Date  . GREEN LIGHT LASER TURP (TRANSURETHRAL RESECTION OF PROSTATE N/A 04/06/2013   Procedure: GREEN LIGHT LASER TURP (TRANSURETHRAL RESECTION OF PROSTATE;  Surgeon: [name], MD;  Location: [facility];  Service: Urology;  Laterality: N/A;  BLADDER BIOPSY       . HERNIA REPAIR    . INGUINAL HERNIA REPAIR  2.27.2006   left w/mesh Dr Pedro Earls   . PACEMAKER PLACEMENT  07/28/2008   (Medtronic Adapta L)  . PACEMAKER PLACEMENT  09/2008   re-implantation   . PACEMAKER REMOVAL  08/2008   Due to MSSA     Family History  Problem Relation Age of Onset  . Heart disease Other     MI, CHF  . Stroke Other     1st degree relative <50  . Cancer Other     colon  . Heart attack Other   . Heart failure Other     Social History    Social History  . Marital status: Married    Spouse name: N/A  . Number of children: N/A  . Years of education: N/A   Occupational History  . RETIRED Pediatrician Retired   Social History Main Topics  . Smoking status: Former Smoker    Quit date: 02/18/1958  . Smokeless tobacco: Never Used  . Alcohol use Yes     Comment: small glass of red wine  - RARELY  . Drug use: No  . Sexual activity: Not Currently   Other Topics Concern  . None   Social History Narrative   Regular exercise - YES   Highest level of education - MD     Current Outpatient Prescriptions:  .  cephALEXin (KEFLEX) 500 MG capsule, Take 1 capsule (500 mg total) by mouth 2 (two) times daily., Disp: 10 capsule, Rfl: 0 .  mupirocin ointment (BACTROBAN) 2 %, Place 1 application into the nose 2 (two) times daily., Disp: 22 g, Rfl: 0  EXAM:  Vitals:   10/13/15 1406  BP: 100/60  Pulse: 87  Temp: 98.9 F (37.2 C)    Body mass index is 23.8 kg/m.  GENERAL: vitals reviewed and listed  above, alert, oriented, appears well hydrated and in no acute distress  SKIN: superficial abrasion R shin several inches in length with small area of surrounding erythema of skin; drying/healing hyperpigmented maculopapular rash on the L flank in dermatomal pattern   MS: moves all extremities without noticeable abnormality  PSYCH: pleasant and cooperative, no obvious depression or anxiety  ASSESSMENT AND PLAN:  Discussed the following assessment and plan:  Skin abrasion Cellulitis of right lower extremity -? Mild cellulitis -tx with mupirocin and short course keflex  -return precautions  Shingles -healing, topical supportive care at this point, antiviral not likely to be useful now -follow up if persists or other concerns  -Patient advised to return or notify a doctor immediately if symptoms worsen or persist or new concerns arise.  Patient Instructions  Please apply the mupirocin ointment twice daily for 5-7  days.  Keflex twice daily for 3-5 days.  Follow up if worsening or persists.  Continue topical treatments for the rash on the trunk as needed and follow up if persists or worsening.   Colin Benton R., DO

## 2015-10-13 NOTE — Progress Notes (Signed)
Pre visit review using our clinic review tool, if applicable. No additional management support is needed unless otherwise documented below in the visit note. 

## 2015-10-13 NOTE — Patient Instructions (Addendum)
Please apply the mupirocin ointment twice daily for 5-7 days.  Keflex twice daily for 3-5 days.  Follow up if worsening or persists.  Continue topical treatments for the rash on the trunk as needed and follow up if persists or worsening.

## 2015-11-24 ENCOUNTER — Encounter: Payer: Self-pay | Admitting: Neurology

## 2015-11-24 ENCOUNTER — Ambulatory Visit (INDEPENDENT_AMBULATORY_CARE_PROVIDER_SITE_OTHER): Payer: Medicare Other | Admitting: Neurology

## 2015-11-24 VITALS — BP 112/70 | HR 91 | Wt 168.0 lb

## 2015-11-24 DIAGNOSIS — G301 Alzheimer's disease with late onset: Secondary | ICD-10-CM

## 2015-11-24 DIAGNOSIS — F0280 Dementia in other diseases classified elsewhere without behavioral disturbance: Secondary | ICD-10-CM | POA: Diagnosis not present

## 2015-11-24 NOTE — Progress Notes (Signed)
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  He is accompanied by his wife who provides history.     UPDATE: He is pleasant and sleeps most of the day.  He does not speak much.  His wife is overwhelmed but is reluctant to get help.  A neighbor comes by to help out about twice a week.  Her son helps out a little bit but she tries to tackle everything herself.  She has seen a therapist once but never returned.  She has attended caregiver support groups and plans to go to an upcoming seminar.  HISTORY: Mild symptoms were first noticed for several years but became most noticeable in 2010.  At first, he would become disoriented while driving on familiar routes.  He exhibited short-term memory problems such as misplacing objects.  He would go to the grocery store for items and forget what to buy.  He has excessive somnolence.  He wakes up around lunch time, which is why he only eats two meals a day.  He is not depressed.  He does not hallucinate.  He does check to see if the doors are locked at night a couple of times before retiring to bed, but he is not paranoid.  He does not drive.  There has been no significant change in personality other than being less energetic.  He is not more irritable.  He is not abusive or combative.  He is still very sweet.  One time, he asked his daughter to remind him his wife's name.  Another time, he put a glove in the oven and left it on.  He typically sits around and sleeps all day.  Neuropsychological testing performed on 03/18/11 revealed marked impairment in memory and certain other cognitive domains, suggestive of probable Alzheimer's dementia.  Neurocognitive testing from 09/17/11 revealed impaired immediate memory, story memory, semantic  fluency, list recall, list recognition, story recall, figure recall, and clock drawing test, was consistent with dementia.  Re-evaluation from 09/15/12 revealed further decline most notably in contextual verbal memory, visuospatial functioning and aspects of executive functioning.     He lives with his wife.  His oldest son has moved in with them.  PAST MEDICAL HISTORY: Past Medical History:  Diagnosis Date  . B12 deficiency   . Bladder neck obstruction    HAS FOLEY CATHETER  . Bradycardia   . CA in situ bladder   . Cancer ([place])    PAST HX OF POLYPS IN BLADDER - REMOVED - WIFE STATES NOT SURE IF CANCEROUS  . Dementia    HAS MEMORY PROBLEMS -DEPENDS ON HIS WIFE TO HELP HIM ANSWER QUESTIONS  . GERD (gastroesophageal reflux disease)    RARE - WOULD TAKE TUMS IF NEEDED  . Gout, unspecified    GOUT FLARE UP LEFT FOOT ON 02/22/13 - RESOLVED AFTER TAKING 2 DOSES OF INDOCIN  . Hyperlipidemia   . Other and unspecified hyperlipidemia   . Pacemaker   . Problems with hearing    WEARS RIGHT HEARING AID  . Syncope and collapse    due to bradycardia/pauses    MEDICATIONS: No current outpatient prescriptions on file prior to visit.   No current facility-administered medications on file prior to visit.     ALLERGIES: No Known Allergies  FAMILY HISTORY:  Family History  Problem Relation Age of Onset  . Heart disease Other     MI, CHF  . Stroke Other     1st degree relative <50  . Cancer Other     colon  . Heart attack Other   . Heart failure Other     SOCIAL HISTORY: Social History   Social History  . Marital status: Married    Spouse name: N/A  . Number of children: N/A  . Years of education: N/A   Occupational History  . RETIRED Pediatrician Retired   Social History Main Topics  . Smoking status: Former Smoker    Quit date: 02/18/1958  . Smokeless tobacco: Never Used  . Alcohol use Yes     Comment: small glass of red wine  - RARELY  . Drug use: No  . Sexual activity:  Not Currently   Other Topics Concern  . Not on file   Social History Narrative   Regular exercise - YES   Highest level of education - MD    REVIEW OF SYSTEMS: Constitutional: No fevers, chills, or sweats, no generalized fatigue, change in appetite Eyes: No visual changes, double vision, eye pain Ear, nose and throat: No hearing loss, ear pain, nasal congestion, sore throat Cardiovascular: No chest pain, palpitations Respiratory:  No shortness of breath at rest or with exertion, wheezes GastrointestinaI: No nausea, vomiting, diarrhea, abdominal pain, fecal incontinence Genitourinary:  No dysuria, urinary retention or frequency Musculoskeletal:  No neck pain, back pain Integumentary: No rash, pruritus, skin lesions Neurological: as above Psychiatric: No depression, insomnia, anxiety Endocrine: No palpitations, fatigue, diaphoresis, mood swings, change in appetite, change in weight, increased thirst Hematologic/Lymphatic:  No purpura, petechiae. Allergic/Immunologic: no itchy/runny eyes, nasal congestion, recent allergic reactions, rashes  PHYSICAL EXAM: Vitals:   11/24/15 1431  BP: 112/70  Pulse: 91   General: No acute distress.  Patient appears well-groomed.  normal body habitus. Head:  Normocephalic/atraumatic Eyes:  Fundi examined but not visualized Neck: supple, no paraspinal tenderness, full range of motion Heart:  Regular rate and rhythm Lungs:  Clear to auscultation bilaterally Back: No paraspinal tenderness Neurological Exam: alert and oriented to person. Attention span and concentration impaired recent and remote memory impaired fund of knowledge impaired.  Speech fluent and not dysarthric, language fair.   MMSE - Mini Mental State Exam 11/24/2015 02/24/2015 05/27/2014 10/20/2013 10/20/2013 01/30/2011  Orientation to time 0 1 0 0 0 5  Orientation to Place 1 1 3 3 3 5   Registration 3 3 3 3 3 3   Attention/ Calculation 1 0 0 4 4 5   Recall 0 3 0 2 2 3   Language- name 2  objects 2 2 2 2 2 2   Language- repeat 1 0 1 1 1 1   Language- follow 3 step command 3 1 3 3 3 3   Language- read & follow direction 1 1 1 1 1 1   Write a sentence 1 1 1 1 1 1   Copy design 0 0 1 1 1 1   Total score 13 13 15 21 21 30    CN II-XII intact. Bulk and tone normal, muscle strength 5/5 throughout.  Sensation to light touch  intact.  Deep tendon reflexes 2+ throughout.  Finger to nose testing intact.  Gait normal  IMPRESSION: Alzheimer's dementia  PLAN: I again recommended that Mrs. Filosa get help or outside caregivers on a more regular basis.  I also provided her with resources where caregivers may inquire about assistance.  They will follow  up in 9 months.  30 minutes spent face to face with patient, over 50% spent counseling.  Metta Clines, DO  CC:  Bluford Kaufmann, MD

## 2015-11-24 NOTE — Patient Instructions (Signed)
Please review the resources/websites provided Follow up in 9 months.

## 2016-01-02 ENCOUNTER — Ambulatory Visit (INDEPENDENT_AMBULATORY_CARE_PROVIDER_SITE_OTHER): Payer: Medicare Other | Admitting: Internal Medicine

## 2016-01-02 ENCOUNTER — Ambulatory Visit (INDEPENDENT_AMBULATORY_CARE_PROVIDER_SITE_OTHER)
Admission: RE | Admit: 2016-01-02 | Discharge: 2016-01-02 | Disposition: A | Discharge status: Home / Self Care | Payer: Medicare Other | Source: Ambulatory Visit | Attending: Internal Medicine | Admitting: Internal Medicine

## 2016-01-02 ENCOUNTER — Encounter: Payer: Self-pay | Admitting: Internal Medicine

## 2016-01-02 ENCOUNTER — Telehealth: Payer: Self-pay | Admitting: Internal Medicine

## 2016-01-02 VITALS — BP 104/60 | Temp 98.3°F | Ht 70.0 in

## 2016-01-02 DIAGNOSIS — R531 Weakness: Secondary | ICD-10-CM | POA: Diagnosis not present

## 2016-01-02 DIAGNOSIS — R41841 Cognitive communication deficit: Secondary | ICD-10-CM

## 2016-01-02 DIAGNOSIS — R600 Localized edema: Secondary | ICD-10-CM

## 2016-01-02 DIAGNOSIS — R400 Somnolence: Secondary | ICD-10-CM

## 2016-01-02 DIAGNOSIS — E538 Deficiency of other specified B group vitamins: Secondary | ICD-10-CM | POA: Diagnosis not present

## 2016-01-02 DIAGNOSIS — Z9181 History of falling: Secondary | ICD-10-CM

## 2016-01-02 DIAGNOSIS — G301 Alzheimer's disease with late onset: Secondary | ICD-10-CM | POA: Diagnosis not present

## 2016-01-02 DIAGNOSIS — F0280 Dementia in other diseases classified elsewhere without behavioral disturbance: Secondary | ICD-10-CM

## 2016-01-02 DIAGNOSIS — R066 Hiccough: Secondary | ICD-10-CM | POA: Diagnosis not present

## 2016-01-02 DIAGNOSIS — R4189 Other symptoms and signs involving cognitive functions and awareness: Secondary | ICD-10-CM

## 2016-01-02 NOTE — Telephone Encounter (Signed)
°  Patient Name: [name]  DOB: 1925/11/05    Initial Comment Caller states husband has Alzheimer's, starting to go downhill. Both feet are swollen, abdomen distended, hand swollen, has had hiccups at night for a week. He fell last night, more disoriented than usual.   Nurse Assessment  Nurse: Julien Girt, RN, Almyra Free Date/Time Eilene Ghazi Time): 01/02/2016 9:51:34 AM  Confirm and document reason for call. If symptomatic, describe symptoms. You must click the next button to save text entered. ---Caller states her husband has Alzheimer's and he is starting to go downhill. States both feet are swollen, his abdomen is distended, both hands are swollen, the left side of his chest looks swollen and he has had hiccups at night for a week. He fell last night with no injury. He is more disoriented than normal. States the swelling began a few days ago but has gotten worse. Son Lovena Le is present on the call.  Has the patient traveled out of the country within the last 30 days? ---Not Applicable  Does the patient have any new or worsening symptoms? ---Yes  Will a triage be completed? ---Yes  Related visit to physician within the last 2 weeks? ---No  Does the PT have any chronic conditions? (i.e. diabetes, asthma, etc.) ---Yes  List chronic conditions. ---Alzheimers, Hx bladder polyps  Is this a behavioral health or substance abuse call? ---No     Guidelines    Guideline Title Affirmed Question Affirmed Notes  Leg Swelling and Edema [1] Thigh, calf, or ankle swelling AND [2] bilateral AND [3] 1 side is more swollen    Final Disposition User   See Physician within 4 Hours (or PCP triage) Julien Girt, RN, Carolann Littler  Caller states she will discuss her husband's other sx with Dr. Regis Bill today and cb as needed .   Referrals  REFERRED TO PCP OFFICE   Disagree/Comply: Comply

## 2016-01-02 NOTE — Telephone Encounter (Signed)
Patient is seeing Dr. Regis Bill this afternoon.

## 2016-01-02 NOTE — Patient Instructions (Addendum)
Consider  c  xray     Because of hiccoughs .  Can get labs alsoi  And swelling   Let you know results   consider seeing neuro again for reversible causes    Will touch base with dr K about other FU .  At this time he does not appear to be in pain and have a local tenderness. However if he begins to be uncomfortable see if you can ascertain where he hurts from. You can give him plain old extra strength Tylenol if appropriate if you think he is hurting.

## 2016-01-02 NOTE — Progress Notes (Signed)
Chief Complaint  Patient presents with  . Bilateral Feet and ankle swelling    Had a fall yesterday.  Possibly hit his head.  Family states his chest and abdomen also look swollen.  Has been making "moaning" noises.  Seems to have continuous hiccups.  Ongoing for about a week.  . Disoriented  . Fall    HPI: [name] 82 y.o.  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 When questioning Dr. done today he states he does not hurt his breathing is fine and doesn't really have a complaint. Family states that he is more sleepy recently. His gait is become more unsteady and needs weaker. Wife was awoken last night when he was moaning and she found him on the floor trying to go to the bathroom. He hasn't had any wetting or incontinence otherwise. He is also recently had severe hiccups have been bothersome to him and makes it hard for him to sleep possibly There is some swelling in his feet. No specific shortness of breath but occasional cough. Feel that his left side of his body is more swollen than his right. He is on no medicine or blood thinners and has no active bleeding history. His appetite had been good until recently. He has had a B12 deficiency and has been taking some orals but hasn't had a shot in the visit. Caretaking is become difficult for wife asked if that hospice referral would be appropriate or helpful. They do have help during the day. ROS: See pertinent positives and negatives per HPI.  Past Medical History:  Diagnosis Date  . B12 deficiency   . Bladder neck obstruction    HAS FOLEY CATHETER  . Bradycardia   . CA in situ bladder   . Cancer ([place])    PAST HX OF POLYPS IN BLADDER - REMOVED - WIFE STATES NOT SURE IF CANCEROUS  . Dementia    HAS MEMORY PROBLEMS -DEPENDS ON HIS WIFE TO HELP  HIM ANSWER QUESTIONS  . GERD (gastroesophageal reflux disease)    RARE - WOULD TAKE TUMS IF NEEDED  . Gout, unspecified    GOUT FLARE UP LEFT FOOT ON 02/22/13 - RESOLVED AFTER TAKING 2 DOSES OF INDOCIN  . Hyperlipidemia   . Other and unspecified hyperlipidemia   . Pacemaker   . Problems with hearing    WEARS RIGHT HEARING AID  . Syncope and collapse    due to bradycardia/pauses    Family History  Problem Relation Age of Onset  . Heart disease Other     MI, CHF  . Stroke Other     1st degree relative <50  . Cancer Other     colon  . Heart attack Other   . Heart failure Other     Social History   Social History  . Marital status: Married    Spouse name: N/A  . Number of children: N/A  . Years of education: N/A   Occupational History  . RETIRED Pediatrician Retired   Social History Main Topics  . Smoking status: Former Smoker    Quit date: 02/18/1958  . Smokeless tobacco: Never Used  . Alcohol use Yes     Comment: small glass of red wine  - RARELY  . Drug use: No  . Sexual activity: Not Currently  Other Topics Concern  . None   Social History Narrative   Regular exercise - YES   Highest level of education - MD    No outpatient prescriptions prior to visit.   No facility-administered medications prior to visit.      EXAM:  BP 104/60 (BP Location: Left Arm, Patient Position: Sitting, Cuff Size: Normal)   Temp 98.3 F (36.8 C) (Oral)   Ht 5\' 10"  (1.778 m)   There is no height or weight on file to calculate BMI.  GENERAL: vitals reviewed and listed above, well-developed recently nourished gentleman in no acute distress with intermittent sleepiness sitting in a wheelchair responsive will answer questions hard of hearing simple answers verbal. Cooperative. HEENT: atraumatic, conjunctiva  clear, no obvious abnormalities on inspection of external nose and ears there is a small abrasion behind his left ear but no bruising on his head  NECK: no obvious masses on  inspection palpation  LUNGS: clear to auscultation bilaterally, no wheezes, rales or rhonchi, no obvious abnormalities at the bases exam was in the chair CV: HRRR, no clubbing cyanosis or  +1 to +2 foot peripheral edema nl cap refill pink no ulcers   MS: moves all extremities  Able to stand with assistance and hold on a don't feel any masses abdominal obvious hernias in any asymmetry may be related to his Asian and how standing. Decrease range of motion left arm shoulder weakness versus pain (wife said he had a fall with a shoulder injury while back and hasn't used his left arm is much is not a new finding.) PSYCH: pleasant and cooperative response to instructions occasionally somnolent. He is mostly alert intermittently not oriented to time and place question person.  ASSESSMENT AND PLAN:  Discussed the following assessment and plan:  Hiccoughs - Plan: DG Chest 2 View, CBC with Differential/Platelet, TSH, T4, free, Hepatic function panel, Basic metabolic panel  Weakness - Plan: DG Chest 2 View, CBC with Differential/Platelet, TSH, T4, free, Hepatic function panel, Basic metabolic panel  Vitamin B 12 deficiency - Plan: CBC with Differential/Platelet, Vitamin B12  Late onset Alzheimer's disease without behavioral disturbance - Plan: DG Chest 2 View, CBC with Differential/Platelet, TSH, T4, free, Hepatic function panel, Basic metabolic panel  Sleepiness - Plan: DG Chest 2 View, CBC with Differential/Platelet, TSH, T4, free, Hepatic function panel, Basic metabolic panel  Hx of fall - Plan: DG Chest 2 View, CBC with Differential/Platelet, TSH, T4, free, Hepatic function panel, Basic metabolic panel  Acute cognitive decline ? acute on chronic   Pedal edema Discussed evaluation for reversible causes of decline. Consideration of head CT if he's having falls however rate of decline according to son does not think he would be a fruitful endeavor.  Metabolic 123456 chest x-ray. Sent home with a urine  cup container we can get you a available to obtain. Discussed with Dr. Raliegh Ip agrees that hospice referral is appropriate at this time. Discussed with family may not qualify but not sure at this time. Currently is not hurting but if he does hurt and there is focal pain can give Tylenol. -Patient advised to return or notify health care team  if symptoms worsen ,persist or new concerns arise. Total visit 1mins > 50% spent counseling and coordinating care as indicated in above note and in instructions to patient .     Patient Instructions  Consider  c  xray     Because of hiccoughs .  Can get labs alsoi  And swelling  Let you know results   consider seeing neuro again for reversible causes    Will touch base with dr Raliegh Ip about other FU .  At this time he does not appear to be in pain and have a local tenderness. However if he begins to be uncomfortable see if you can ascertain where he hurts from. You can give him plain old extra strength Tylenol if appropriate if you think he is hurting.      Standley Brooking. Panosh M.D.

## 2016-01-03 LAB — BASIC METABOLIC PANEL
BUN: 26 mg/dL — ABNORMAL HIGH (ref 6–23)
CO2: 26 mEq/L (ref 19–32)
Calcium: 8.5 mg/dL (ref 8.4–10.5)
Chloride: 100 mEq/L (ref 96–112)
Creatinine, Ser: 1.51 mg/dL — ABNORMAL HIGH (ref 0.40–1.50)
GFR: 46.58 mL/min — AB (ref >60.00)
Glucose, Bld: 115 mg/dL — ABNORMAL HIGH (ref 70–99)
POTASSIUM: 4.6 meq/L (ref 3.5–5.1)
SODIUM: 137 meq/L (ref 135–145)

## 2016-01-03 LAB — HEPATIC FUNCTION PANEL
ALT: 36 U/L (ref 0–53)
AST: 36 U/L (ref 0–37)
Albumin: 3.7 g/dL (ref 3.5–5.2)
Alkaline Phosphatase: 82 U/L (ref 39–117)
BILIRUBIN TOTAL: 0.6 mg/dL (ref 0.2–1.2)
Bilirubin, Direct: 0.2 mg/dL (ref 0.0–0.3)
TOTAL PROTEIN: 7.1 g/dL (ref 6.0–8.3)

## 2016-01-03 LAB — CBC WITH DIFFERENTIAL/PLATELET
BASOS ABS: 0 10*3/uL (ref 0.0–0.1)
Basophils Relative: 0.3 % (ref 0.0–3.0)
Eosinophils Absolute: 0.1 10*3/uL (ref 0.0–0.7)
Eosinophils Relative: 0.8 % (ref 0.0–5.0)
HCT: 42 % (ref 39.0–52.0)
Hemoglobin: 14.3 g/dL (ref 13.0–17.0)
LYMPHS ABS: 0.7 10*3/uL (ref 0.7–4.0)
Lymphocytes Relative: 10 % — ABNORMAL LOW (ref 12.0–46.0)
MCHC: 34.1 g/dL (ref 30.0–36.0)
MCV: 87.7 fl (ref 78.0–100.0)
MONO ABS: 0.9 10*3/uL (ref 0.1–1.0)
MONOS PCT: 13.1 % — AB (ref 3.0–12.0)
NEUTROS ABS: 5.4 10*3/uL (ref 1.4–7.7)
NEUTROS PCT: 75.8 % (ref 43.0–77.0)
RBC: 4.79 Mil/uL (ref 4.22–5.81)
RDW: 14.4 % (ref 11.5–15.5)
WBC: 7.1 10*3/uL (ref 4.0–10.5)

## 2016-01-03 LAB — VITAMIN B12: VITAMIN B 12: 364 pg/mL (ref 211–911)

## 2016-01-03 LAB — T4, FREE: Free T4: 0.71 ng/dL (ref 0.60–1.60)

## 2016-01-03 LAB — TSH: TSH: 5.48 u[IU]/mL — ABNORMAL HIGH (ref 0.35–4.50)

## 2016-01-09 ENCOUNTER — Ambulatory Visit (INDEPENDENT_AMBULATORY_CARE_PROVIDER_SITE_OTHER): Payer: Medicare Other | Admitting: Internal Medicine

## 2016-01-09 ENCOUNTER — Encounter: Payer: Self-pay | Admitting: Internal Medicine

## 2016-01-09 VITALS — BP 90/60 | HR 70 | Temp 98.1°F

## 2016-01-09 DIAGNOSIS — E538 Deficiency of other specified B group vitamins: Secondary | ICD-10-CM

## 2016-01-09 DIAGNOSIS — E785 Hyperlipidemia, unspecified: Secondary | ICD-10-CM

## 2016-01-09 DIAGNOSIS — G301 Alzheimer's disease with late onset: Secondary | ICD-10-CM | POA: Diagnosis not present

## 2016-01-09 DIAGNOSIS — F0280 Dementia in other diseases classified elsewhere without behavioral disturbance: Secondary | ICD-10-CM

## 2016-01-09 DIAGNOSIS — Z23 Encounter for immunization: Secondary | ICD-10-CM

## 2016-01-09 DIAGNOSIS — I495 Sick sinus syndrome: Secondary | ICD-10-CM | POA: Diagnosis not present

## 2016-01-09 DIAGNOSIS — Z950 Presence of cardiac pacemaker: Secondary | ICD-10-CM

## 2016-01-09 NOTE — Progress Notes (Signed)
Subjective:    Patient ID: [name], male    DOB: 1926/02/11, 82 y.o.   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  He has been followed by neurology due to Alzheimer's dementia.  More recently, patient has deteriorated with poor appetite, increasing weakness, confusion.  His wife states that he is spending more time throughout the day sleeping.  There has been no fever. The patient was evaluated recently for reversible/metabolic complications.  Laboratory studies including chest x-ray unremarkable. Hospice referral.  Discussed.  His wife.  Basically is quite overwhelmed due to increasing patient care needs.  Sheis considering placement at an extended care facility but has some financial concerns about this arrangement.  Past Medical History:  Diagnosis Date  . B12 deficiency   . Bladder neck obstruction    HAS FOLEY CATHETER  . Bradycardia   . CA in situ bladder   . Cancer ([place])    PAST HX OF POLYPS IN BLADDER - REMOVED - WIFE STATES NOT SURE IF CANCEROUS  . Dementia    HAS MEMORY PROBLEMS -DEPENDS ON HIS WIFE TO HELP HIM ANSWER QUESTIONS  . GERD (gastroesophageal reflux disease)    RARE - WOULD TAKE TUMS IF NEEDED  . Gout, unspecified    GOUT FLARE UP LEFT FOOT ON 02/22/13 - RESOLVED AFTER TAKING 2 DOSES OF INDOCIN  . Hyperlipidemia   . Other and unspecified hyperlipidemia   . Pacemaker   . Problems with hearing    WEARS RIGHT HEARING AID  . Syncope and collapse    due to bradycardia/pauses     Social History   Social History  . Marital status: Married    Spouse name: N/A  . Number of children: N/A  . Years of education: N/A   Occupational History  . RETIRED Pediatrician Retired   Social History Main Topics  . Smoking status: Former Smoker    Quit date: 02/18/1958  . Smokeless tobacco: Never Used  . Alcohol use Yes     Comment: small glass of red wine  - RARELY  . Drug use: No  . Sexual activity:  Not Currently   Other Topics Concern  . Not on file   Social History Narrative   Regular exercise - YES   Highest level of education - MD    Past Surgical History:  Procedure Laterality Date  . GREEN LIGHT LASER TURP (TRANSURETHRAL RESECTION OF PROSTATE N/A 04/06/2013   Procedure: GREEN LIGHT LASER TURP (TRANSURETHRAL RESECTION OF PROSTATE;  Surgeon: [name], MD;  Location: [facility];  Service: Urology;  Laterality: N/A;  BLADDER BIOPSY       . HERNIA REPAIR    . INGUINAL HERNIA REPAIR  2.27.2006   left w/mesh Dr Pedro Earls   . PACEMAKER PLACEMENT  07/28/2008   ([employer] Adapta L)  . PACEMAKER PLACEMENT  09/2008   re-implantation   . PACEMAKER REMOVAL  08/2008   Due to MSSA     Family History  Problem Relation Age of Onset  . Heart disease Other     MI, CHF  . Stroke Other     1st degree relative <50  . Cancer Other     colon  . Heart attack Other   . Heart failure Other     No Known Allergies  No current outpatient prescriptions on file prior to visit.   No current facility-administered medications on file prior to visit.  BP 90/60 (BP Location: Left Arm, Patient Position: Sitting, Cuff Size: Normal)   Pulse 70   Temp 98.1 F (36.7 C) (Oral)   SpO2 93%     Review of Systems  Constitutional: Positive for activity change, appetite change and fatigue. Negative for chills and fever.  HENT: Negative for congestion, dental problem, ear pain, hearing loss, sore throat, tinnitus, trouble swallowing and voice change.   Eyes: Negative for pain, discharge and visual disturbance.  Respiratory: Negative for cough, chest tightness, wheezing and stridor.   Cardiovascular: Negative for chest pain, palpitations and leg swelling.  Gastrointestinal: Negative for abdominal distention, abdominal pain, blood in stool, constipation, diarrhea, nausea and vomiting.  Genitourinary: Negative for difficulty urinating, discharge, flank pain, genital sores,  hematuria and urgency.  Musculoskeletal: Positive for gait problem. Negative for arthralgias, back pain, joint swelling, myalgias and neck stiffness.  Skin: Negative for rash.  Neurological: Negative for dizziness, syncope, speech difficulty, weakness, numbness and headaches.  Hematological: Negative for adenopathy. Does not bruise/bleed easily.  Psychiatric/Behavioral: Positive for confusion, decreased concentration and sleep disturbance. Negative for behavioral problems and dysphoric mood. The patient is not nervous/anxious.        Objective:   Physical Exam  Constitutional: He is oriented to person, place, and time. He appears well-developed.  Marked cognitive impairment Responses to verbal stimuli Blood pressure low normal  Wheelchair bound  HENT:  Head: Normocephalic.  Right Ear: External ear normal.  Left Ear: External ear normal.  Eyes: Conjunctivae and EOM are normal.  Neck: Normal range of motion.  Cardiovascular: Normal rate and normal heart sounds.   Pulmonary/Chest: Breath sounds normal.  Abdominal: Bowel sounds are normal.  Musculoskeletal: Normal range of motion. He exhibits no edema or tenderness.  Decreased range of motion left shoulder  Neurological: He is alert and oriented to person, place, and time.  Generally weak but nonfocal  Psychiatric: He has a normal mood and affect. His behavior is normal.          Assessment & Plan:   Progressive Alzheimer's dementia.  Natural progression of the disease discussed at length.  Patient's wife wishes to pursue hospice evaluation.  She will consider nursing home placement.  She was encouraged to begin placement proceedings Vitamin B12 deficiency Sick  Sinus syndrome.  Status post pacemaker insertion  Nyoka Cowden

## 2016-01-09 NOTE — Progress Notes (Signed)
Pre visit review using our clinic review tool, if applicable. No additional management support is needed unless otherwise documented below in the visit note. 

## 2016-01-09 NOTE — Patient Instructions (Signed)
Hospice referral as discussed  Call or return to clinic prn if these symptoms worsen or fail to improve as anticipated.  Return in 3 months for follow-up

## 2016-01-10 ENCOUNTER — Other Ambulatory Visit: Payer: Self-pay | Admitting: Internal Medicine

## 2016-01-10 DIAGNOSIS — F0280 Dementia in other diseases classified elsewhere without behavioral disturbance: Secondary | ICD-10-CM

## 2016-01-10 DIAGNOSIS — G301 Alzheimer's disease with late onset: Principal | ICD-10-CM

## 2016-01-15 ENCOUNTER — Emergency Department (HOSPITAL_COMMUNITY)
Admission: EM | Admit: 2016-01-15 | Discharge: 2016-01-15 | Disposition: A | Discharge status: Home / Self Care | Payer: Medicare Other | Attending: Emergency Medicine | Admitting: Emergency Medicine

## 2016-01-15 ENCOUNTER — Emergency Department (HOSPITAL_COMMUNITY): Payer: Medicare Other

## 2016-01-15 ENCOUNTER — Telehealth: Payer: Self-pay | Admitting: *Deleted

## 2016-01-15 ENCOUNTER — Encounter (HOSPITAL_COMMUNITY): Payer: Self-pay | Admitting: Nurse Practitioner

## 2016-01-15 DIAGNOSIS — Z87891 Personal history of nicotine dependence: Secondary | ICD-10-CM | POA: Insufficient documentation

## 2016-01-15 DIAGNOSIS — Z950 Presence of cardiac pacemaker: Secondary | ICD-10-CM | POA: Insufficient documentation

## 2016-01-15 DIAGNOSIS — R4182 Altered mental status, unspecified: Secondary | ICD-10-CM | POA: Insufficient documentation

## 2016-01-15 DIAGNOSIS — Z8551 Personal history of malignant neoplasm of bladder: Secondary | ICD-10-CM | POA: Diagnosis not present

## 2016-01-15 DIAGNOSIS — R410 Disorientation, unspecified: Secondary | ICD-10-CM | POA: Diagnosis present

## 2016-01-15 LAB — URINE MICROSCOPIC-ADD ON
Bacteria, UA: NONE SEEN
Squamous Epithelial / LPF: NONE SEEN

## 2016-01-15 LAB — URINALYSIS, ROUTINE W REFLEX MICROSCOPIC
GLUCOSE, UA: NEGATIVE mg/dL
KETONES UR: NEGATIVE mg/dL
Nitrite: NEGATIVE
PROTEIN: 100 mg/dL — AB
Specific Gravity, Urine: 1.021 (ref 1.005–1.030)
pH: 5.5 (ref 5.0–8.0)

## 2016-01-15 LAB — CBC WITH DIFFERENTIAL/PLATELET
BASOS ABS: 0.1 10*3/uL (ref 0.0–0.1)
BASOS PCT: 1 %
EOS ABS: 0.1 10*3/uL (ref 0.0–0.7)
Eosinophils Relative: 2 %
HCT: 37.7 % — ABNORMAL LOW (ref 39.0–52.0)
Hemoglobin: 12.7 g/dL — ABNORMAL LOW (ref 13.0–17.0)
Lymphocytes Relative: 14 %
Lymphs Abs: 0.9 10*3/uL (ref 0.7–4.0)
MCH: 29.5 pg (ref 26.0–34.0)
MCHC: 33.7 g/dL (ref 30.0–36.0)
MCV: 87.7 fL (ref 78.0–100.0)
MONO ABS: 0.5 10*3/uL (ref 0.1–1.0)
Monocytes Relative: 8 %
NEUTROS PCT: 75 %
Neutro Abs: 4.7 10*3/uL (ref 1.7–7.7)
PLATELETS: 84 10*3/uL — AB (ref 150–400)
RBC: 4.3 MIL/uL (ref 4.22–5.81)
RDW: 15.3 % (ref 11.5–15.5)
WBC: 6.3 10*3/uL (ref 4.0–10.5)

## 2016-01-15 LAB — COMPREHENSIVE METABOLIC PANEL
ALK PHOS: 73 U/L (ref 38–126)
ALT: 28 U/L (ref 17–63)
ANION GAP: 7 (ref 5–15)
AST: 40 U/L (ref 15–41)
Albumin: 3 g/dL — ABNORMAL LOW (ref 3.5–5.0)
BILIRUBIN TOTAL: 1 mg/dL (ref 0.3–1.2)
BUN: 32 mg/dL — ABNORMAL HIGH (ref 6–20)
CALCIUM: 8.4 mg/dL — AB (ref 8.9–10.3)
CO2: 23 mmol/L (ref 22–32)
CREATININE: 1.63 mg/dL — AB (ref 0.61–1.24)
Chloride: 108 mmol/L (ref 101–111)
GFR, EST AFRICAN AMERICAN: 42 mL/min — AB (ref >60)
GFR, EST NON AFRICAN AMERICAN: 36 mL/min — AB (ref >60)
Glucose, Bld: 117 mg/dL — ABNORMAL HIGH (ref 65–99)
Potassium: 4.5 mmol/L (ref 3.5–5.1)
SODIUM: 138 mmol/L (ref 135–145)
TOTAL PROTEIN: 8 g/dL (ref 6.5–8.1)

## 2016-01-15 LAB — PATHOLOGIST SMEAR REVIEW

## 2016-01-15 MED ORDER — TUBERCULIN PPD 5 UNIT/0.1ML ID SOLN
5.0000 [IU] | Freq: Once | INTRADERMAL | Status: DC
  Administered 2016-01-15: 5 [IU] via INTRADERMAL
  Filled 2016-01-15: qty 0.1

## 2016-01-15 NOTE — Progress Notes (Signed)
CSW spoke with patients wife, [name] and daughter, [name] at bedside. Daughter stated patient woke up complaining of pain, fever, and chills so patient was brought to the ED. They stated they have been working with [facility] for patient to go into their facility. They stated they had not had an FL2 completed at this time. Daughter stated she would go to patient's PCP, [place] regarding FL2.  CSW spoke with patient's wife who stated the daughter went back to the doctors office to obtain paperwork. She stated that [facility] would be able to accept patient on today. This information of patient being able to go to [facility] has not been verified and 2nd Shift CSW will be updated. Staffed this information with Nanda Quinton, MD.    Merry Proud, [facility] Clinical Social Worker [phone]

## 2016-01-15 NOTE — ED Triage Notes (Signed)
Family states he has had decrease mental status over the past week along with foul smelling dark urine and fever. Patient has a history of dementia.

## 2016-01-15 NOTE — ED Notes (Signed)
Bed: WA06 Expected date:  Expected time:  Means of arrival:  Comments: EMS- 82yo M, fever/UTI?

## 2016-01-15 NOTE — Progress Notes (Signed)
CSW called [facility] and confirmed that patient is able to go to facility today 11/27. Patients family provided FL2 for facility and [facility] is ready for patient once medically stable for discharge. [facility] has requested two AVS, one for patient and one for [employer]. CSW will update patient and family.   Kingsley Spittle, LCSWA Clinical Social Worker [phone]

## 2016-01-15 NOTE — Telephone Encounter (Signed)
Left message on voicemail home and mobile that [facility] forms are ready to be picked up will be at the front desk. Any questions please call office.

## 2016-01-15 NOTE — Discharge Instructions (Signed)
You were seen in the ED today with altered mental status. We found no evidence of an acute medical reason to explain your symptoms. We are discharging you to a nursing home at this time.   Return to the ED immediately with any fever, chills, abdominal pain, chest pain, or difficulty breathing.   Contact your PCP in the coming week to repeat lab work and make sure that your kidney labs stay near your baseline levels.

## 2016-01-15 NOTE — Telephone Encounter (Signed)
Pt's wife came by and picked up FL2 forms.

## 2016-01-15 NOTE — ED Provider Notes (Signed)
Emergency Department Provider Note   I have reviewed the triage vital signs and the nursing notes.   HISTORY  Chief Complaint No chief complaint on file.   HPI [name] is a 82 y.o. male with PMH of dementia, HLD, and bradycardia presents to the emergency department for evaluation of increased confusion, foul-smelling urine, and generalized weakness. The patient's wife states that she's been having more more difficulty with him at home and is making plans to primary care physician to transition to him to a nursing home. She is in the process of completing this paperwork and hopes to have placed this week. Over the holidays seemed more confused and began running fevers. No productive cough. No vomiting or diarrhea. The patient has had little change in his oral intake.   The patient has advanced dementia and is unable to provide significant  HPI or ROS.    Past Medical History:  Diagnosis Date  . B12 deficiency   . Bladder neck obstruction    HAS FOLEY CATHETER  . Bradycardia   . CA in situ bladder   . Cancer ([place])    PAST HX OF POLYPS IN BLADDER - REMOVED - WIFE STATES NOT SURE IF CANCEROUS  . Dementia    HAS MEMORY PROBLEMS -DEPENDS ON HIS WIFE TO HELP HIM ANSWER QUESTIONS  . GERD (gastroesophageal reflux disease)    RARE - WOULD TAKE TUMS IF NEEDED  . Gout, unspecified    GOUT FLARE UP LEFT FOOT ON 02/22/13 - RESOLVED AFTER TAKING 2 DOSES OF INDOCIN  . Hyperlipidemia   . Other and unspecified hyperlipidemia   . Pacemaker   . Problems with hearing    WEARS RIGHT HEARING AID  . Syncope and collapse    due to bradycardia/pauses    Patient Active Problem List   Diagnosis Date Noted  . Sick sinus syndrome ([place]) 01/23/2015  . Pacemaker-Medtronic 11/19/2011  . Alzheimer's dementia 04/24/2011  . B12 deficiency 06/28/2009  . Memory loss 06/23/2009  . Bradycardia 07/21/2008  . BLADDER OUTLET OBSTRUCTION 02/03/2008  . Dyslipidemia 09/08/2006  . CA IN SITU, BLADDER  09/04/2006  . GOUT 09/04/2006    Past Surgical History:  Procedure Laterality Date  . GREEN LIGHT LASER TURP (TRANSURETHRAL RESECTION OF PROSTATE N/A 04/06/2013   Procedure: GREEN LIGHT LASER TURP (TRANSURETHRAL RESECTION OF PROSTATE;  Surgeon: [name], MD;  Location: [facility];  Service: Urology;  Laterality: N/A;  BLADDER BIOPSY       . HERNIA REPAIR    . INGUINAL HERNIA REPAIR  2.27.2006   left w/mesh Dr Pedro Earls   . PACEMAKER PLACEMENT  07/28/2008   (Medtronic Adapta L)  . PACEMAKER PLACEMENT  09/2008   re-implantation   . PACEMAKER REMOVAL  08/2008   Due to MSSA     Current Outpatient Rx  . Order #: [id number] Class: Historical Med  . Order #: [id number] Class: Historical Med  . Order #: [id number] Class: Historical Med    Allergies Patient has no known allergies.  Family History  Problem Relation Age of Onset  . Heart disease Other     MI, CHF  . Stroke Other     1st degree relative <50  . Cancer Other     colon  . Heart attack Other   . Heart failure Other     Social History Social History  Substance Use Topics  . Smoking status: Former Smoker    Quit date: 02/18/1958  . Smokeless tobacco: Never Used  .  Alcohol use Yes     Comment: small glass of red wine  - RARELY    Review of Systems  Level 5 caveat. Advanced dementia. Unable to provide ROS.   ____________________________________________   PHYSICAL EXAM:  VITAL SIGNS: ED Triage Vitals  Enc Vitals Group     BP 01/15/16 0839 142/64     Pulse Rate 01/15/16 0839 89     Resp 01/15/16 0839 18     Temp 01/15/16 0839 98 F (36.7 C)     Temp Source 01/15/16 0839 Oral     SpO2 01/15/16 0839 96 %     Height 01/15/16 0841 5\' 10"  (1.778 m)   Constitutional: Alert and oriented. Well appearing and in no acute distress. Eyes: Conjunctivae are normal.  Head: Atraumatic. Nose: No congestion/rhinnorhea. Mouth/Throat: Mucous membranes are slightly dry. Oropharynx non-erythematous. Neck:  No stridor.   Cardiovascular: Normal rate, regular rhythm. Good peripheral circulation. Grossly normal heart sounds.   Respiratory: Normal respiratory effort.  No retractions. Lungs CTAB. Gastrointestinal: Soft and nontender. No distention.  Musculoskeletal: No lower extremity tenderness nor edema. No gross deformities of extremities. Neurologic:  Normal speech and language. No gross focal neurologic deficits are appreciated.  Skin:  Skin is warm, dry and intact. No rash noted.  ____________________________________________   LABS (all labs ordered are listed, but only abnormal results are displayed)  Labs Reviewed  COMPREHENSIVE METABOLIC PANEL - Abnormal; Notable for the following:       Result Value   Glucose, Bld 117 (*)    BUN 32 (*)    Creatinine, Ser 1.63 (*)    Calcium 8.4 (*)    Albumin 3.0 (*)    GFR calc non Af Amer 36 (*)    GFR calc Af Amer 42 (*)    All other components within normal limits  CBC WITH DIFFERENTIAL/PLATELET - Abnormal; Notable for the following:    Hemoglobin 12.7 (*)    HCT 37.7 (*)    Platelets 84 (*)    All other components within normal limits  URINALYSIS, ROUTINE W REFLEX MICROSCOPIC (NOT AT [facility]) - Abnormal; Notable for the following:    Color, Urine RED (*)    APPearance CLOUDY (*)    Hgb urine dipstick LARGE (*)    Bilirubin Urine MODERATE (*)    Protein, ur 100 (*)    Leukocytes, UA SMALL (*)    All other components within normal limits  PATHOLOGIST SMEAR REVIEW  URINE MICROSCOPIC-ADD ON   ____________________________________________  EKG   EKG Interpretation  Date/Time:  Monday January 15 2016 09:58:28 EST Ventricular Rate:  88 PR Interval:    QRS Duration: 94 QT Interval:  362 QTC Calculation: 438 R Axis:   14 Text Interpretation:  Sinus rhythm Low voltage, extremity leads Baseline wander in lead(s) I III aVL aVF No STEMI. Similar to prior.  Confirmed by LONG MD, JOSHUA [phone]) on 01/15/2016 10:05:10 AM       ____________________________________________  RADIOLOGY  Dg Chest 2 View  Result Date: 01/15/2016 CLINICAL DATA:  Decreased mental status over the past week with fever and foul-smelling urine. History of bladder malignancy. EXAM: CHEST  2 VIEW COMPARISON:  Chest x-ray of January 02, 2016 FINDINGS: The lungs are adequately inflated. There is no focal infiltrate. There is no pleural effusion. The heart and pulmonary vascularity are normal. There is calcification in the wall of the aortic arch. The ICD is in stable position. There is mild degenerative disc disease of the thoracic and upper  lumbar spine. There is moderate degenerative change of the shoulders. IMPRESSION: No evidence of pneumonia, CHF, nor other acute cardiopulmonary abnormality. Thoracic aortic atherosclerosis. Electronically Signed   By: David  Martinique M.D.   On: 01/15/2016 09:29    ____________________________________________   PROCEDURES  Procedure(s) performed:   Procedures  None ____________________________________________   INITIAL IMPRESSION / ASSESSMENT AND PLAN / ED COURSE  Pertinent labs & imaging results that were available during my care of the patient were reviewed by me and considered in my medical decision making (see chart for details).  Patient presents to the emergency department for evaluation of altered mental status and fevers. He has baseline dementia and is unable to provide significant history. On my initial exam the patient is awake and alert. He seems to have baseline confusion. No rash. Plan for labs, urinalysis, chest x-ray to evaluate for acute infection which may be worsening acute on chronic symptoms.   04:35 PM Patient with no UTI. Does have some hematuria but nursing notes a mildly traumatic foley catheter placement in the ED. Patient serum Cr is 1.63 from a baseline of approximately 1.5.  Platelets are low but found to be similar by PCP in office recently. Plan for repeat labs as  outpatient. Recommended repeat creatinine and CBC in 1 week and will encourage PO intake and fluids. Patient's bladder scan with around 200 ml of urine. No indication at this time for admission. The paperwork was filed and the patient has been accepted at the [facility]. Plan for discharge and transport there. Discussed case with daughter at bedside.  ____________________________________________  FINAL CLINICAL IMPRESSION(S) / ED DIAGNOSES  Final diagnoses:  Altered mental status, unspecified altered mental status type     MEDICATIONS GIVEN DURING THIS VISIT:  None  NEW OUTPATIENT MEDICATIONS STARTED DURING THIS VISIT:  None   Note:  This document was prepared using Dragon voice recognition software and may include unintentional dictation errors.  Nanda Quinton, MD Emergency Medicine   Margette Fast, MD 01/16/16 [phone]

## 2016-01-15 NOTE — Progress Notes (Signed)
Patient and family updated on bed availability at [facility]. RN updated and transportation to be scheduled.   Kingsley Spittle, [facility] Clinical Social Worker [phone]

## 2016-01-15 NOTE — ED Notes (Addendum)
Condom cath placed on patient in attempts to get urine specimen.

## 2016-01-24 ENCOUNTER — Telehealth: Payer: Self-pay | Admitting: Internal Medicine

## 2016-01-24 NOTE — Telephone Encounter (Signed)
New Message  Pts wife voiced she is calling to let us know pt is in hospice care at [facility].  Please f/u with pt if needed

## 2016-01-25 NOTE — Telephone Encounter (Signed)
Pts wife stated that pt has been transferred to Hospice and is not able to travel anymore to [facility] apt. Informed pts wife that at pts last PPM check in clinic pt had 8 years left on battery and was functioning normally. Pts wife would like for Dr. Rayann Heman to know that pts is comfortable and being taken care of, and she is grateful for Dr. Jackalyn Lombard kindness and care of her husband these past years.

## 2016-01-29 ENCOUNTER — Encounter: Payer: Medicare Other | Admitting: Internal Medicine

## 2016-01-30 ENCOUNTER — Telehealth: Payer: Self-pay | Admitting: Internal Medicine

## 2016-01-30 NOTE — Telephone Encounter (Signed)
Noted  

## 2016-01-30 NOTE — Telephone Encounter (Signed)
Wife would like you to know pt has passed away on 2022/06/19. Dr w/ hospice at [facility] signed the death certificate.  Pt's obituary is in today's paper.  Wife thanks you so much for your great care!!

## 2016-02-19 DEATH — deceased

## 2016-03-12 ENCOUNTER — Ambulatory Visit: Payer: Medicare Other | Admitting: Internal Medicine

## 2016-08-23 ENCOUNTER — Ambulatory Visit: Payer: Medicare Other | Admitting: Neurology

## 2018-01-03 IMAGING — DX DG CHEST 2V
2 series · 2 of 2 positions shown · non-contrast
Comparison: 03/29/2013

CLINICAL DATA: Hiccups for 1 week, dry cough at night, pacemaker,
Alzheimer dementia, weakness, sleepiness, falls

EXAM:
CHEST  2 VIEW

[chest pa]
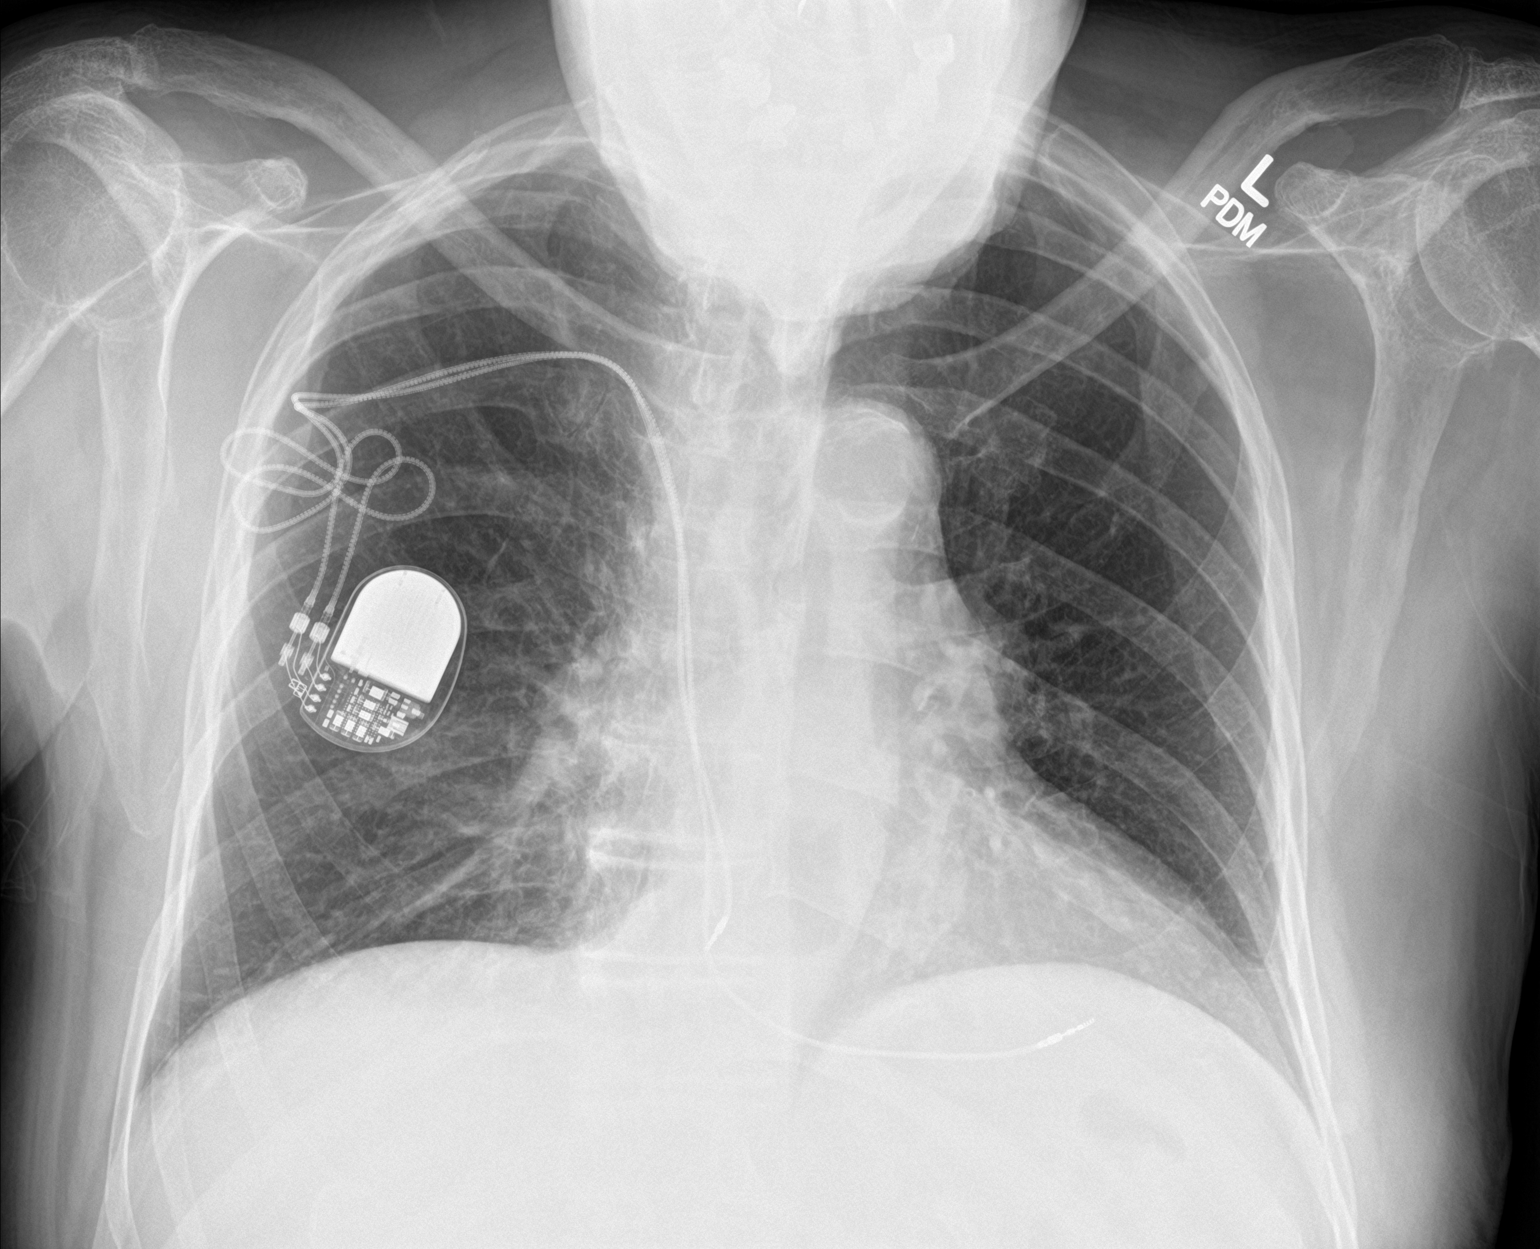

[chest lat]
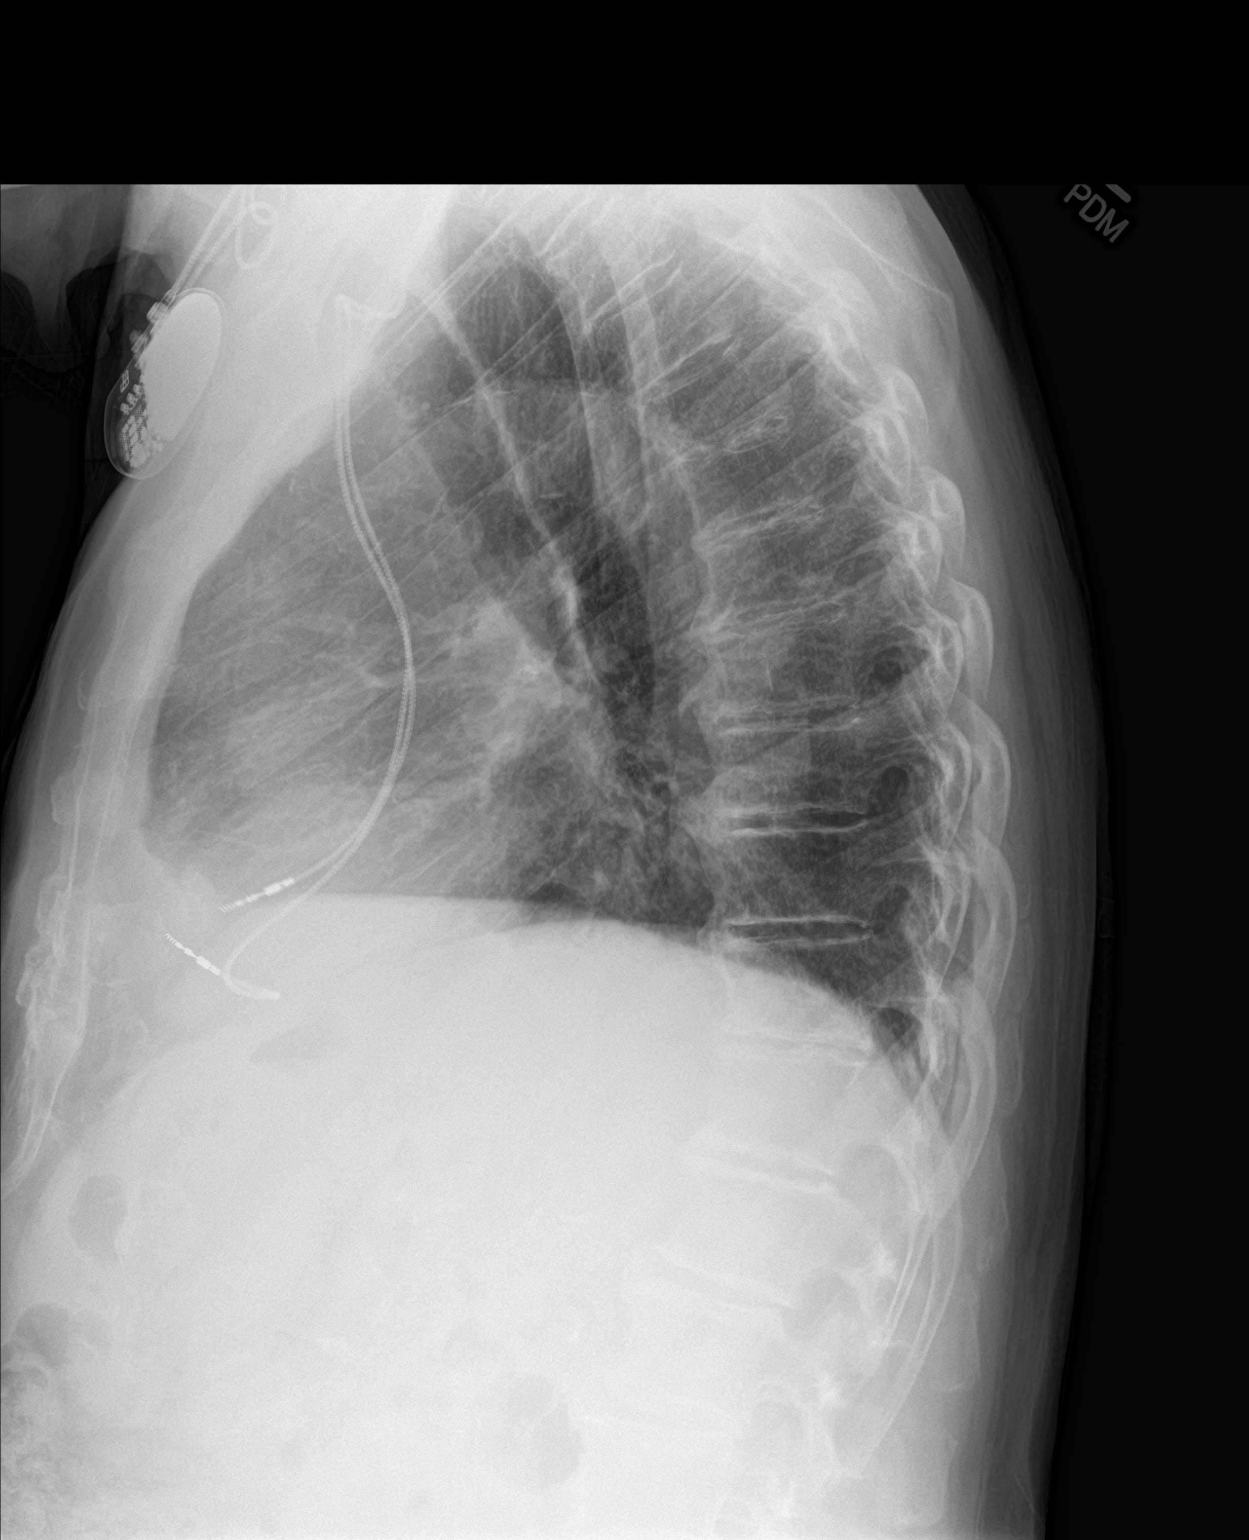

[2 of 2 positions shown; findings below may reference images not displayed]

FINDINGS: RIGHT subclavian transvenous pacemaker leads project at RIGHT atrium
and RIGHT ventricle, unchanged.

Upper normal heart size.

Atherosclerotic calcification aorta.

Mediastinal contours and pulmonary vascularity normal.

Minimal RIGHT basilar atelectasis.

Lungs otherwise clear.

No pleural effusion or pneumothorax.

Bones demineralized.
IMPRESSION: Minimal RIGHT basilar atelectasis.

Aortic atherosclerosis.
